# Patient Record
Sex: Male | Born: 1950 | Race: White | Hispanic: No | Marital: Single | Smoking: Never smoker
Health system: Southern US, Community
[De-identification: ages and names within clinical notes are randomized; demographics above are authoritative.]

## PROBLEM LIST (undated history)

## (undated) DIAGNOSIS — I2510 Atherosclerotic heart disease of native coronary artery without angina pectoris: Secondary | ICD-10-CM

## (undated) DIAGNOSIS — I219 Acute myocardial infarction, unspecified: Secondary | ICD-10-CM

## (undated) DIAGNOSIS — M1990 Unspecified osteoarthritis, unspecified site: Secondary | ICD-10-CM

## (undated) DIAGNOSIS — R51 Headache: Secondary | ICD-10-CM

## (undated) DIAGNOSIS — R519 Headache, unspecified: Secondary | ICD-10-CM

## (undated) DIAGNOSIS — M25469 Effusion, unspecified knee: Secondary | ICD-10-CM

## (undated) HISTORY — PX: CARDIAC SURGERY: SHX584

## (undated) HISTORY — PX: CARDIAC CATHETERIZATION: SHX172

---

## 2012-06-29 ENCOUNTER — Encounter (HOSPITAL_COMMUNITY): Payer: Self-pay | Admitting: Emergency Medicine

## 2012-06-29 ENCOUNTER — Emergency Department (HOSPITAL_COMMUNITY): Payer: Self-pay

## 2012-06-29 ENCOUNTER — Emergency Department (HOSPITAL_COMMUNITY)
Admission: EM | Admit: 2012-06-29 | Discharge: 2012-06-29 | Disposition: A | Discharge status: Home / Self Care | Payer: Self-pay | Attending: Emergency Medicine | Admitting: Emergency Medicine

## 2012-06-29 DIAGNOSIS — M549 Dorsalgia, unspecified: Secondary | ICD-10-CM | POA: Insufficient documentation

## 2012-06-29 DIAGNOSIS — Y9389 Activity, other specified: Secondary | ICD-10-CM | POA: Insufficient documentation

## 2012-06-29 DIAGNOSIS — I2510 Atherosclerotic heart disease of native coronary artery without angina pectoris: Secondary | ICD-10-CM | POA: Insufficient documentation

## 2012-06-29 DIAGNOSIS — R296 Repeated falls: Secondary | ICD-10-CM | POA: Insufficient documentation

## 2012-06-29 DIAGNOSIS — Y9289 Other specified places as the place of occurrence of the external cause: Secondary | ICD-10-CM | POA: Insufficient documentation

## 2012-06-29 DIAGNOSIS — S22080A Wedge compression fracture of T11-T12 vertebra, initial encounter for closed fracture: Secondary | ICD-10-CM

## 2012-06-29 DIAGNOSIS — Z7982 Long term (current) use of aspirin: Secondary | ICD-10-CM | POA: Insufficient documentation

## 2012-06-29 DIAGNOSIS — Z7902 Long term (current) use of antithrombotics/antiplatelets: Secondary | ICD-10-CM | POA: Insufficient documentation

## 2012-06-29 DIAGNOSIS — S22009A Unspecified fracture of unspecified thoracic vertebra, initial encounter for closed fracture: Secondary | ICD-10-CM | POA: Insufficient documentation

## 2012-06-29 DIAGNOSIS — Z79899 Other long term (current) drug therapy: Secondary | ICD-10-CM | POA: Insufficient documentation

## 2012-06-29 DIAGNOSIS — W309XXA Contact with unspecified agricultural machinery, initial encounter: Secondary | ICD-10-CM | POA: Insufficient documentation

## 2012-06-29 HISTORY — DX: Atherosclerotic heart disease of native coronary artery without angina pectoris: I25.10

## 2012-06-29 MED ORDER — HYDROCODONE-ACETAMINOPHEN 5-325 MG PO TABS: ORAL_TABLET | ORAL | Status: DC

## 2012-06-29 MED ORDER — IBUPROFEN 800 MG PO TABS: 800.0000 mg | ORAL_TABLET | Freq: Three times a day (TID) | ORAL | Status: DC

## 2012-06-29 NOTE — ED Provider Notes (Signed)
History    This chart was scribed for Geoffrey Kelly working with Geoffrey Kelly. Geoffrey Payor, MD by ED Scribe, Burman Nieves. This patient was seen in room TR07C/TR07C and the patient's care was started at 2:23 PM.   CSN: [id number]  Arrival date & time 06/29/12  1208   First MD Initiated Contact with Patient 06/29/12 1423      Chief Complaint  Patient presents with  . Back Pain  . Fall    (Consider location/radiation/quality/duration/timing/severity/associated sxs/prior treatment) Patient is a 62 y.o. male presenting with back pain and fall. The history is provided by the patient.  Back Pain Location:  Lumbar spine and thoracic spine Pain severity:  Moderate Timing:  Constant Progression:  Unchanged Context: falling   Relieved by:  Nothing Worsened by:  Movement Associated symptoms: no dysuria, no fever and no weakness   Fall Pertinent negatives include no fever, no nausea and no vomiting.   Geoffrey Kelly is a 63 y.o. male who presents to the Emergency Department complaining of moderate constant back pain resulting from a fall onset 5-6 days ago. Pt was lifting up a big log while operating his tractor. The log caused the tractor to "seesaw" causing the tractor to fall with the pt in it. Pt states that pain is a 9/10 on a pain scale. He states that at night when he gets up from lying down the pain is not as persistent until he starts moving. Pt states that he can not bend over due to the pain. Pt states he has been taking prescribed medication from a previous surgery which does not seem to alleviate sx's.  Pt states that he has had back problems before but not as serious as sx's he is experiencing as of now. Pt denies bladder/bowel incontinence, numbness/tingling in extremities, fever, chills, cough, nausea, vomiting, diarrhea, SOB, weakness, and any other associated symptoms. Pt has past medical hx of coronary artery disease and is currently on BP medication.   Past Medical History   Diagnosis Date  . Coronary artery disease     History reviewed. No pertinent past surgical history.  History reviewed. No pertinent family history.  History  Substance Use Topics  . Smoking status: Never Smoker   . Smokeless tobacco: Not on file  . Alcohol Use: No      Review of Systems  Constitutional: Negative for fever and chills.  HENT: Negative for congestion and neck stiffness.   Respiratory: Negative for shortness of breath.   Gastrointestinal: Negative for nausea and vomiting.  Genitourinary: Negative for dysuria.  Musculoskeletal: Positive for back pain.  Neurological: Negative for weakness.    Allergies  Review of patient's allergies indicates no known allergies.  Home Medications   Current Outpatient Rx  Name  Route  Sig  Dispense  Refill  . aspirin 81 MG tablet   Oral   Take 81 mg by mouth daily.         . clopidogrel (PLAVIX) 75 MG tablet   Oral   Take 75 mg by mouth daily.         Marland Kitchen lisinopril (PRINIVIL,ZESTRIL) 2.5 MG tablet   Oral   Take 1.25 mg by mouth daily.         . metoprolol tartrate (LOPRESSOR) 25 MG tablet   Oral   Take 25 mg by mouth 2 (two) times daily.         . pravastatin (PRAVACHOL) 40 MG tablet   Oral   Take 40 mg by mouth  daily.         Marland Kitchen HYDROcodone-acetaminophen (NORCO/VICODIN) 5-325 MG per tablet      Take 1-2 tabs by mouth every 4-6hrs as needed for pain.   10 tablet   0   . ibuprofen (ADVIL,MOTRIN) 800 MG tablet   Oral   Take 1 tablet (800 mg total) by mouth 3 (three) times daily.   21 tablet   0     BP 97/67  Pulse 61  Temp(Src) 97.9 F (36.6 C) (Oral)  Resp 18  SpO2 96%  Physical Exam  Nursing note and vitals reviewed. Constitutional: He is oriented to person, place, and time. He appears well-developed and well-nourished. No distress.  HENT:  Head: Normocephalic and atraumatic.  Eyes: EOM are normal.  Neck: Neck supple. No tracheal deviation present.  Cardiovascular: Normal rate.    Pulmonary/Chest: Effort normal. No respiratory distress.  Abdominal: Soft. Bowel sounds are normal. He exhibits no distension. There is no tenderness.  Musculoskeletal: Normal range of motion. He exhibits tenderness. He exhibits no edema.  Pt is tender upon palpation on T-9 to L-5 of his spine. Pain with hip flexion and rotation at the waist.   Neurological: He is alert and oriented to person, place, and time. He has normal strength. No cranial nerve deficit or sensory deficit. He exhibits normal muscle tone. He displays a negative Romberg sign. Coordination and gait normal.  Negative Romberg and Antalgic gate. Normal intact sensation.   Skin: Skin is warm and dry.  Psychiatric: He has a normal mood and affect. His behavior is normal.    ED Course  Procedures (including critical care time) DIAGNOSTIC STUDIES: Oxygen Saturation is 96% on room air, adequate by my interpretation.    COORDINATION OF CARE: 3:17 PM Discussed ED treatment with pt and pt agrees.  3:25 PM Discussed xray results with pt and pt agrees.   Labs Reviewed - No data to display Dg Lumbar Spine Complete  06/29/2012  *RADIOLOGY REPORT*  Clinical Data: Tractor injury 5 days ago.  Low back pain.  LUMBAR SPINE - COMPLETE 4+ VIEW  Comparison: None.  Findings: There is mild curvature convex to the left in the thoracolumbar region.  There are diminutive twelfth ribs with five non-rib bearing lumbar-type vertebral bodies.  I think there is probably an acute compression fracture at T12, more on the right, with loss of height of only about 10%.  No other fractures seen. There is ordinary degenerative facet disease in the lower lumbar spine.  IMPRESSION: Suspicion of acute but minor compression fracture at T12 with loss of height of about 10%, more on the right side of the vertebral body.   Original Report Authenticated By: Paulina Fusi, M.D.      1. Compression fracture of T12 vertebra, initial encounter   2. Back pain        MDM  Pt c/o back pain x1wk after tractor accident.  Pain has improved some since initial injury.  Denies hitting head or LOC.  Denies head or neck pain.  Denies numbness or tingling in arms, legs, or groin.  No loss of bowel or bladder. Plain film: mild compression fx at T12. Discussed with Dr. Rubin Kelly: w/o neurological symptoms, no further imaging is needed at this time.  Advised pt to take it easy for next 1-2wks if possible.  Pt explained it is corn season so he needs to do a lot of lifting.  Advised pt to listen to his own body and not try to work  through intense pain.  Return to ED or f/u with Dr. Leroy Kennedy, neurosurgery, if develops numbness or tingling in groin or legs.  Loss of bowel or bladder, unable to void.  Or other worrisome symptoms occur. Rx: norco and ibuprofen.  Advised not to operate heavy machinery while on norco.   I personally performed the services described in this documentation, which was scribed in my presence. The recorded information has been reviewed and is accurate.  Vitals: unremarkable. Discharged in stable condition.    Discussed pt with attending during ED encounter.      Geoffrey Finner, PA-C 06/30/12 1827

## 2012-06-29 NOTE — ED Notes (Signed)
Pt c/o lower back pain after the tractor he was on dropped approx 5 ft. This happened 5 days ago. States has been taking some "old pain pills" that he had from previous injury. Denies paresthesias.

## 2012-06-29 NOTE — ED Notes (Signed)
Pt c/o lower back pain after falling approx 5 feet 6 days ago

## 2012-07-01 NOTE — ED Provider Notes (Signed)
Medical screening examination/treatment/procedure(s) were performed by non-physician practitioner and as supervising physician I was immediately available for consultation/collaboration.  Josefina Rynders R. Elsworth Ledin, MD 07/01/12 0020 

## 2014-08-29 ENCOUNTER — Other Ambulatory Visit: Payer: Self-pay | Admitting: Unknown Physician Specialty

## 2014-08-29 DIAGNOSIS — M25561 Pain in right knee: Secondary | ICD-10-CM

## 2014-09-02 ENCOUNTER — Ambulatory Visit: Payer: Self-pay

## 2014-09-30 ENCOUNTER — Other Ambulatory Visit: Payer: Self-pay | Admitting: Unknown Physician Specialty

## 2014-09-30 ENCOUNTER — Other Ambulatory Visit: Payer: Self-pay | Admitting: Sports Medicine

## 2014-09-30 DIAGNOSIS — M25561 Pain in right knee: Secondary | ICD-10-CM

## 2014-10-02 ENCOUNTER — Other Ambulatory Visit: Payer: Self-pay

## 2014-10-03 ENCOUNTER — Emergency Department (HOSPITAL_COMMUNITY)
Admission: EM | Admit: 2014-10-03 | Discharge: 2014-10-03 | Disposition: A | Discharge status: Home / Self Care | Payer: 59 | Attending: Emergency Medicine | Admitting: Emergency Medicine

## 2014-10-03 ENCOUNTER — Encounter (HOSPITAL_COMMUNITY): Payer: Self-pay

## 2014-10-03 DIAGNOSIS — I2510 Atherosclerotic heart disease of native coronary artery without angina pectoris: Secondary | ICD-10-CM | POA: Insufficient documentation

## 2014-10-03 DIAGNOSIS — Z79899 Other long term (current) drug therapy: Secondary | ICD-10-CM | POA: Diagnosis not present

## 2014-10-03 DIAGNOSIS — M5416 Radiculopathy, lumbar region: Secondary | ICD-10-CM | POA: Diagnosis not present

## 2014-10-03 DIAGNOSIS — M545 Low back pain: Secondary | ICD-10-CM | POA: Diagnosis present

## 2014-10-03 DIAGNOSIS — Z7982 Long term (current) use of aspirin: Secondary | ICD-10-CM | POA: Diagnosis not present

## 2014-10-03 DIAGNOSIS — Z7902 Long term (current) use of antithrombotics/antiplatelets: Secondary | ICD-10-CM | POA: Diagnosis not present

## 2014-10-03 MED ORDER — METHOCARBAMOL 1000 MG/10ML IJ SOLN
1000.0000 mg | Freq: Once | INTRAVENOUS | Status: AC
  Administered 2014-10-03: 1000 mg via INTRAVENOUS
  Filled 2014-10-03: qty 10

## 2014-10-03 MED ORDER — METHYLPREDNISOLONE SODIUM SUCC 125 MG IJ SOLR
125.0000 mg | Freq: Once | INTRAMUSCULAR | Status: AC
Start: 2014-10-03 — End: 2014-10-03
  Administered 2014-10-03: 125 mg via INTRAVENOUS
  Filled 2014-10-03: qty 2

## 2014-10-03 MED ORDER — METHOCARBAMOL 1000 MG/10ML IJ SOLN
1000.0000 mg | Freq: Once | INTRAMUSCULAR | Status: DC
  Filled 2014-10-03: qty 10

## 2014-10-03 MED ORDER — SENNOSIDES-DOCUSATE SODIUM 8.6-50 MG PO TABS: 1.0000 | ORAL_TABLET | Freq: Every evening | ORAL | Status: DC | PRN

## 2014-10-03 MED ORDER — OXYCODONE-ACETAMINOPHEN 5-325 MG PO TABS: ORAL_TABLET | ORAL | Status: DC

## 2014-10-03 MED ORDER — PREDNISONE 20 MG PO TABS: 40.0000 mg | ORAL_TABLET | Freq: Every day | ORAL | Status: DC

## 2014-10-03 MED ORDER — MORPHINE SULFATE 4 MG/ML IJ SOLN
4.0000 mg | Freq: Once | INTRAMUSCULAR | Status: AC
  Administered 2014-10-03: 4 mg via INTRAVENOUS
  Filled 2014-10-03: qty 1

## 2014-10-03 MED ORDER — METHOCARBAMOL 500 MG PO TABS: 1000.0000 mg | ORAL_TABLET | Freq: Four times a day (QID) | ORAL | Status: DC | PRN

## 2014-10-03 NOTE — ED Notes (Signed)
Pt. Ambulated in hallway independently with no complaints of pain or dizziness. Gait steady and even.

## 2014-10-03 NOTE — ED Provider Notes (Signed)
CSN: [id number]     Arrival date & time 10/03/14  1052 History   First MD Initiated Contact with Patient 10/03/14 1056     Chief Complaint  Patient presents with  . Back Pain     (Consider location/radiation/quality/duration/timing/severity/associated sxs/prior Treatment) HPI  Blood pressure 120/90, pulse 72, temperature 97.7 F (36.5 C), temperature source Oral, resp. rate 20, SpO2 96 %.  Geoffrey Kelly is a 64 y.o. male complaining of severe low back pain that radiates down the posterior left leg to the knee. States that this has been worsening over the course of several weeks. Patient denies any trauma, history of cancer, history of IV drug use, fever, chills, incontinence, abdominal pain. Patient is normally ambulatory independently right now he is ambulating with a crutch. 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 He states that he's been taking Vicodin and Flexeril at home with little relief, states that the pain keeps him from sleep at night. Patient was sent to the ED by his PCP, he had an MRI several days ago it is unclear what the results are. Patient states that he recently had a steroid-induced shot into the right knee and states that this completely resolved his pain, he denies any swelling and reduced range of motion.  Past Medical History  Diagnosis Date  . Coronary artery disease    Past Surgical History  Procedure Laterality Date  . Cardiac surgery      stent placement 2013   No family history on file. History  Substance Use Topics  . Smoking status: Never Smoker   . Smokeless tobacco: Not on file  . Alcohol Use: No    Review of Systems  10 systems reviewed and found to be negative, except as noted in the HPI.   Allergies  Review of patient's allergies indicates no known allergies.  Home Medications   Prior to Admission medications   Medication Sig Start Date End Date Taking? Authorizing Provider  aspirin 81 MG tablet  Take 81 mg by mouth daily.    Historical Provider, MD  clopidogrel (PLAVIX) 75 MG tablet Take 75 mg by mouth daily.    Historical Provider, MD  HYDROcodone-acetaminophen (NORCO/VICODIN) 5-325 MG per tablet Take 1-2 tabs by mouth every 4-6hrs as needed for pain. 06/29/12   Junius Finner, PA-C  ibuprofen (ADVIL,MOTRIN) 800 MG tablet Take 1 tablet (800 mg total) by mouth 3 (three) times daily. 06/29/12   Junius Finner, PA-C  lisinopril (PRINIVIL,ZESTRIL) 2.5 MG tablet Take 1.25 mg by mouth daily.    Historical Provider, MD  metoprolol tartrate (LOPRESSOR) 25 MG tablet Take 25 mg by mouth 2 (two) times daily.    Historical Provider, MD  pravastatin (PRAVACHOL) 40 MG tablet Take 40 mg by mouth daily.    Historical Provider, MD   BP 129/79 mmHg  Pulse 66  Temp(Src) 97.7 F (36.5 C) (Oral)  Resp 22  SpO2 96% Physical Exam  Constitutional: He appears well-developed and well-nourished.  HENT:  Head: Normocephalic.  Mouth/Throat: Oropharynx is clear and moist.  Eyes: Conjunctivae are normal.  Neck: Normal range of motion.  Cardiovascular: Normal rate, regular rhythm and intact distal pulses.   Pulmonary/Chest: Effort normal and breath sounds normal.  Abdominal: Soft. Bowel sounds are normal. There is no tenderness.  Musculoskeletal: He exhibits tenderness.       Arms: Neurological: He is alert.  No point tenderness to percussion of lumbar spinal processes.  No TTP or paraspinal muscular spasm.  Strength is 5 out of 5 to bilateral lower extremities at hip and knee; extensor hallucis longus 5 out of 5. Ankle strength 5 out of 5, no clonus, neurovascularly intact. No saddle anaesthesia. Patellar reflexes are 2+ bilaterally.    Straight leg raise is positive on the left side at 40, negative on the right.  Right knee:No deformity, erythema or abrasions. FROM. No effusion or crepitance. Anterior and posterior drawer show no abnormal laxity. Stable to valgus and varus stress. Joint lines are non-tender.  Neurovascularly intact. Pt ambulates with non-antalgic gait.     Psychiatric: He has a normal mood and affect.  Nursing note and vitals reviewed.   ED Course  Procedures (including critical care time) Labs Review Labs Reviewed - No data to display  Imaging Review No results found.   EKG Interpretation None      MDM   Final diagnoses:  None    Filed Vitals:   10/03/14 1145 10/03/14 1230 10/03/14 1315 10/03/14 1445  BP: 129/79 132/90 123/84 120/90  Pulse: 66 71 62 72  Temp:      TempSrc:      Resp:  20 20 20   SpO2: 96% 97% 95% 96%    Medications  morphine 4 MG/ML injection 4 mg (4 mg Intravenous Given 10/03/14 1303)  methylPREDNISolone sodium succinate (SOLU-MEDROL) 125 mg/2 mL injection 125 mg (125 mg Intravenous Given 10/03/14 1302)  methocarbamol (ROBAXIN) 1,000 mg in dextrose 5 % 50 mL IVPB (0 mg Intravenous Stopped 10/03/14 1424)    Geoffrey Kelly is a pleasant 64 y.o. male presenting with atraumatic low back pain with left-sided radiculopathy. Neuro exam is nonfocal. Patient was sent by primary care for unclear reasons. I have obtained the repeat of his MRI and it shows a mild degenerative disease without central canal stenosis, degenerative disc and endplate spur at the L2-L3 level bilaterally without compression or displacement.   Patient states that he "wants this fixed." He initially declined pain medication. Explained to him that quick fixes normally not an option with lumbar pain. I've convinced him to try a stronger pain medication. He is ambulatory without issue with more aggressive pain control. Patient will be given neurosurgical referral and advised to follow closely with his orthopedist at [facility].  Evaluation does not show pathology that would require ongoing emergent intervention or inpatient treatment. Pt is hemodynamically stable and mentating appropriately. Discussed findings and plan with patient/guardian, who agrees with care plan. All  questions answered. Return precautions discussed and outpatient follow up given.   Discharge Medication List as of 10/03/2014  2:34 PM    START taking these medications   Details  methocarbamol (ROBAXIN) 500 MG tablet Take 2 tablets (1,000 mg total) by mouth 4 (four) times daily as needed (Pain)., Starting 10/03/2014, Until Discontinued, Print    oxyCODONE-acetaminophen (PERCOCET/ROXICET) 5-325 MG per tablet 1 to 2 tabs PO q6hrs  PRN for pain, Print    predniSONE (DELTASONE) 20 MG tablet Take 2 tablets (40 mg total) by mouth daily., Starting 10/03/2014, Until Discontinued, Print    senna-docusate (SENOKOT-S) 8.6-50 MG per tablet Take 1 tablet by mouth at bedtime as needed for mild constipation., Starting 10/03/2014, Until Discontinued, Print             Wynetta Emery, PA-C 10/03/14 1538  Elwin Mocha, MD 10/03/14 [phone]

## 2014-10-03 NOTE — ED Notes (Signed)
Pt. States he had an MRI done at [place] today for back pain. Pt. States he tried to call and make an appointment with his family dr. But was told to come here instead. Pt. States pain is not bad now, but states he has had some difficulty sleeping at night due to pain. Pt. States pain hurts back and left leg.

## 2014-10-03 NOTE — Discharge Instructions (Signed)
Please take ibuprofen 400mg  (this is normally 2 over the counter pills) every 6 hours (take with food to minimze stomach irritation).   Take robaxin and/or percocet for breakthrough pain, do not drink alcohol, drive, care for children or perfom other critical tasks while taking robaxin and/or percocet.  Please follow with your primary care doctor in the next 2 days for a check-up. They must obtain records for further management.   Do not hesitate to return to the Emergency Department for any new, worsening or concerning symptoms.    Lumbosacral Radiculopathy Lumbosacral radiculopathy is a pinched nerve or nerves in the low back (lumbosacral area). When this happens you may have weakness in your legs and may not be able to stand on your toes. You may have pain going down into your legs. There may be difficulties with walking normally. There are many causes of this problem. Sometimes this may happen from an injury, or simply from arthritis or boney problems. It may also be caused by other illnesses such as diabetes. If there is no improvement after treatment, further studies may be done to find the exact cause. DIAGNOSIS  X-rays may be needed if the problems become long standing. Electromyograms may be done. This study is one in which the working of nerves and muscles is studied. HOME CARE INSTRUCTIONS   Applications of ice packs may be helpful. Ice can be used in a plastic bag with a towel around it to prevent frostbite to skin. This may be used every 2 hours for 20 to 30 minutes, or as needed, while awake, or as directed by your caregiver.  Only take over-the-counter or prescription medicines for pain, discomfort, or fever as directed by your caregiver.  If physical therapy was prescribed, follow your caregiver's directions. SEEK IMMEDIATE MEDICAL CARE IF:   You have pain not controlled with medications.  You seem to be getting worse rather than better.  You develop increasing weakness in  your legs.  You develop loss of bowel or bladder control.  You have difficulty with walking or balance, or develop clumsiness in the use of your legs.  You have a fever. MAKE SURE YOU:   Understand these instructions.  Will watch your condition.  Will get help right away if you are not doing well or get worse. Document Released: 02/18/2005 Document Revised: 05/13/2011 Document Reviewed: 10/09/2007 [facility] Patient Information 2015 [place], [place]. This information is not intended to replace advice given to you by your health care provider. Make sure you discuss any questions you have with your health care provider.

## 2014-10-06 ENCOUNTER — Other Ambulatory Visit: Payer: Self-pay

## 2014-10-12 DIAGNOSIS — E785 Hyperlipidemia, unspecified: Secondary | ICD-10-CM | POA: Insufficient documentation

## 2014-10-12 DIAGNOSIS — I25119 Atherosclerotic heart disease of native coronary artery with unspecified angina pectoris: Secondary | ICD-10-CM

## 2014-10-12 HISTORY — DX: Hyperlipidemia, unspecified: E78.5

## 2014-10-12 HISTORY — DX: Atherosclerotic heart disease of native coronary artery with unspecified angina pectoris: I25.119

## 2014-10-28 ENCOUNTER — Other Ambulatory Visit: Payer: Self-pay | Admitting: Neurosurgery

## 2014-10-31 NOTE — Progress Notes (Signed)
Patient's sister called with questions about where to bring patient day of surgery, instructions on medications, etc. I advised her that I could not give out patient specific information. I give her directions to the hospital. I contacted patient. He advised that the office had contacted him regarding the PAT appointment.

## 2014-11-02 ENCOUNTER — Encounter (HOSPITAL_COMMUNITY): Payer: Self-pay

## 2014-11-02 ENCOUNTER — Encounter (HOSPITAL_COMMUNITY)
Admission: RE | Admit: 2014-11-02 | Discharge: 2014-11-02 | Disposition: A | Discharge status: Home / Self Care | Payer: 59 | Source: Ambulatory Visit | Attending: Neurosurgery | Admitting: Neurosurgery

## 2014-11-02 DIAGNOSIS — I2510 Atherosclerotic heart disease of native coronary artery without angina pectoris: Secondary | ICD-10-CM | POA: Diagnosis not present

## 2014-11-02 DIAGNOSIS — M5116 Intervertebral disc disorders with radiculopathy, lumbar region: Secondary | ICD-10-CM | POA: Diagnosis present

## 2014-11-02 DIAGNOSIS — Z955 Presence of coronary angioplasty implant and graft: Secondary | ICD-10-CM | POA: Diagnosis not present

## 2014-11-02 DIAGNOSIS — I252 Old myocardial infarction: Secondary | ICD-10-CM | POA: Diagnosis not present

## 2014-11-02 DIAGNOSIS — Z7982 Long term (current) use of aspirin: Secondary | ICD-10-CM | POA: Diagnosis not present

## 2014-11-02 DIAGNOSIS — Z79899 Other long term (current) drug therapy: Secondary | ICD-10-CM | POA: Diagnosis not present

## 2014-11-02 DIAGNOSIS — M1990 Unspecified osteoarthritis, unspecified site: Secondary | ICD-10-CM | POA: Diagnosis not present

## 2014-11-02 HISTORY — DX: Headache, unspecified: R51.9

## 2014-11-02 HISTORY — DX: Acute myocardial infarction, unspecified: I21.9

## 2014-11-02 HISTORY — DX: Headache: R51

## 2014-11-02 HISTORY — DX: Unspecified osteoarthritis, unspecified site: M19.90

## 2014-11-02 HISTORY — DX: Effusion, unspecified knee: M25.469

## 2014-11-02 LAB — BASIC METABOLIC PANEL
ANION GAP: 6 (ref 5–15)
BUN: 10 mg/dL (ref 6–20)
CHLORIDE: 103 mmol/L (ref 101–111)
CO2: 27 mmol/L (ref 22–32)
Calcium: 9.6 mg/dL (ref 8.9–10.3)
Creatinine, Ser: 0.85 mg/dL (ref 0.61–1.24)
GFR calc non Af Amer: >60 mL/min (ref >60)
Glucose, Bld: 101 mg/dL — ABNORMAL HIGH (ref 65–99)
Potassium: 4.3 mmol/L (ref 3.5–5.1)
Sodium: 136 mmol/L (ref 135–145)

## 2014-11-02 LAB — CBC
HEMATOCRIT: 42.8 % (ref 39.0–52.0)
HEMOGLOBIN: 13.9 g/dL (ref 13.0–17.0)
MCH: 28.5 pg (ref 26.0–34.0)
MCHC: 32.5 g/dL (ref 30.0–36.0)
MCV: 87.7 fL (ref 78.0–100.0)
Platelets: 247 10*3/uL (ref 150–400)
RBC: 4.88 MIL/uL (ref 4.22–5.81)
RDW: 13.6 % (ref 11.5–15.5)
WBC: 6.7 10*3/uL (ref 4.0–10.5)

## 2014-11-02 LAB — SURGICAL PCR SCREEN
MRSA, PCR: NEGATIVE
STAPHYLOCOCCUS AUREUS: NEGATIVE

## 2014-11-02 MED ORDER — CEFAZOLIN SODIUM-DEXTROSE 2-3 GM-% IV SOLR
2.0000 g | INTRAVENOUS | Status: AC
  Administered 2014-11-03: 2 g via INTRAVENOUS
  Filled 2014-11-02: qty 50

## 2014-11-02 NOTE — Pre-Procedure Instructions (Signed)
    Geoffrey Kelly  11/02/2014      [facility] [address], [place] - [address] [place] [address] Phone: [phone] Fax: [phone]    Your procedure is scheduled on 11/03/14.  Report to [facility] Admitting at 1145 A.M.  Call this number if you have problems the morning of surgery:  [phone]   Remember:  Do not eat food or drink liquids after midnight.  Take these medicines the morning of surgery with A SIP OF WATER --metoprolol   Do not wear jewelry, make-up or nail polish.  Do not wear lotions, powders, or perfumes.  You may wear deodorant.  Do not shave 48 hours prior to surgery.  Men may shave face and neck.  Do not bring valuables to the hospital.  [facility] is not responsible for any belongings or valuables.  Contacts, dentures or bridgework may not be worn into surgery.  Leave your suitcase in the car.  After surgery it may be brought to your room.  For patients admitted to the hospital, discharge time will be determined by your treatment team.  Patients discharged the day of surgery will not be allowed to drive home.   Name and phone number of your driver:   Special instructions:    Please read over the following fact sheets that you were given. Pain Booklet, Coughing and Deep Breathing, MRSA Information and Surgical Site Infection Prevention

## 2014-11-02 NOTE — Progress Notes (Signed)
Anesthesia PAT Evaluation: Patient is a 64 year old male scheduled for left L4-5 diskectomy tomorrow by Dr. Lovell Sheehan.  History includes non-smoker, CAD/MI s/p stent '13 ([facility]), arthritis, headaches. He has also been seen for a right knee effusion s/p aspiration ~ May or June 2016. His ANA direct, RA Latex Turbid, sed rate (Westergren), Lyme Ab, and [place] Spotted Fever IgM testing were unremarkable (08/26/14; [facility]). He has persistent right knee swelling, but not as significant as when he had his aspiration. He received a cortisone injection ~ three weeks ago. No fever or right knee redness. His mobility is affected by his knee pain. He and his brother think he may need rehab post-operatively.   Meds include ASA, Lopressor, pravastatin.   PCP is Dr. Brent Bulla.   He was seen by cardiologist Dr. Belva Crome with [facility] on 10/12/14 for a preoperative evaluation. Has also seen Dr. Bing Matter.in the past. Dr. Homero Fellers recommended a Lexiscan sestamibi, and if negative patient would not be considered at high risk for coronary events for this procedure.   10/18/14 EKG: SR, right atrial abnormality and rotation, possible pulmonology disease.   10/25/14 Nuclear stress test [facility]):  1. No evidence of ischemia on the scan. 2. Normal EF, 67%.. 3. No significant symptoms, EKG changes, or arrhythmias occurred.  Preoperative labs noted. (Patient had labs from 10/18/14 that I initially thought would likely be okay to use for surgery; however, after reported persistent knee swelling I opted to have him get a CBC and BMET that could evaluate for leukocytosis or renal insufficiency. Labs were unremarkable. There was no right knee erythema on exam today.)  I notified Darl Pikes at Dr. Lovell Sheehan' office of patient' right knee effusion. She said this was also outlined in Dr. Lovell Sheehan' office notes. I also notified her that patient was requesting post-operative rehab services. I  asked patient to also discuss with him tomorrow.   2013 Cardiac cath report requested from [facility], but is still pending. (Update: [facility] records still pending after 4 PM. I called and fax requested cath and echo report as available. Holding room staff can follow-up records if I don't receive them by 5 PM today.) With recent non-ischemic stress test, I would anticipate that he could proceed as planned if no acute changes.   Velna Ochs [facility] Short Stay Center/Anesthesiology Phone [phone] 11/02/2014 1:21 PM

## 2014-11-03 ENCOUNTER — Encounter (HOSPITAL_COMMUNITY): Payer: Self-pay | Admitting: *Deleted

## 2014-11-03 ENCOUNTER — Observation Stay (HOSPITAL_COMMUNITY)
Admission: RE | Admit: 2014-11-03 | Discharge: 2014-11-04 | Disposition: A | Discharge status: Home / Self Care | Payer: 59 | Source: Ambulatory Visit | Attending: Neurosurgery | Admitting: Neurosurgery

## 2014-11-03 ENCOUNTER — Encounter (HOSPITAL_COMMUNITY)
Admission: RE | Disposition: A | Discharge status: Home / Self Care | Payer: Self-pay | Source: Ambulatory Visit | Attending: Neurosurgery

## 2014-11-03 ENCOUNTER — Ambulatory Visit (HOSPITAL_COMMUNITY): Payer: 59 | Admitting: Emergency Medicine

## 2014-11-03 ENCOUNTER — Ambulatory Visit (HOSPITAL_COMMUNITY): Payer: 59

## 2014-11-03 ENCOUNTER — Ambulatory Visit (HOSPITAL_COMMUNITY): Payer: 59 | Admitting: Certified Registered Nurse Anesthetist

## 2014-11-03 DIAGNOSIS — Z419 Encounter for procedure for purposes other than remedying health state, unspecified: Secondary | ICD-10-CM

## 2014-11-03 DIAGNOSIS — M1990 Unspecified osteoarthritis, unspecified site: Secondary | ICD-10-CM | POA: Insufficient documentation

## 2014-11-03 DIAGNOSIS — M5126 Other intervertebral disc displacement, lumbar region: Secondary | ICD-10-CM

## 2014-11-03 DIAGNOSIS — Z7982 Long term (current) use of aspirin: Secondary | ICD-10-CM | POA: Insufficient documentation

## 2014-11-03 DIAGNOSIS — M5116 Intervertebral disc disorders with radiculopathy, lumbar region: Principal | ICD-10-CM | POA: Insufficient documentation

## 2014-11-03 DIAGNOSIS — I2510 Atherosclerotic heart disease of native coronary artery without angina pectoris: Secondary | ICD-10-CM | POA: Insufficient documentation

## 2014-11-03 DIAGNOSIS — I252 Old myocardial infarction: Secondary | ICD-10-CM | POA: Insufficient documentation

## 2014-11-03 DIAGNOSIS — Z79899 Other long term (current) drug therapy: Secondary | ICD-10-CM | POA: Insufficient documentation

## 2014-11-03 DIAGNOSIS — Z955 Presence of coronary angioplasty implant and graft: Secondary | ICD-10-CM | POA: Insufficient documentation

## 2014-11-03 HISTORY — PX: LUMBAR LAMINECTOMY/DECOMPRESSION MICRODISCECTOMY: SHX5026

## 2014-11-03 HISTORY — DX: Other intervertebral disc displacement, lumbar region: M51.26

## 2014-11-03 SURGERY — LUMBAR LAMINECTOMY/DECOMPRESSION MICRODISCECTOMY 1 LEVEL
Anesthesia: General | Laterality: Left

## 2014-11-03 MED ORDER — MENTHOL 3 MG MT LOZG
1.0000 | LOZENGE | OROMUCOSAL | Status: DC | PRN
Start: 2014-11-03 — End: 2014-11-04
  Filled 2014-11-03: qty 9

## 2014-11-03 MED ORDER — ONDANSETRON HCL 4 MG/2ML IJ SOLN
INTRAMUSCULAR | Status: AC
  Filled 2014-11-03: qty 2

## 2014-11-03 MED ORDER — HYDROMORPHONE HCL 1 MG/ML IJ SOLN: 0.2500 mg | INTRAMUSCULAR | Status: DC | PRN

## 2014-11-03 MED ORDER — ALUM & MAG HYDROXIDE-SIMETH 200-200-20 MG/5ML PO SUSP: 30.0000 mL | Freq: Four times a day (QID) | ORAL | Status: DC | PRN

## 2014-11-03 MED ORDER — PROMETHAZINE HCL 25 MG/ML IJ SOLN: 6.2500 mg | INTRAMUSCULAR | Status: DC | PRN

## 2014-11-03 MED ORDER — GLYCOPYRROLATE 0.2 MG/ML IJ SOLN
INTRAMUSCULAR | Status: AC
  Filled 2014-11-03: qty 2

## 2014-11-03 MED ORDER — ONDANSETRON HCL 4 MG/2ML IJ SOLN: 4.0000 mg | INTRAMUSCULAR | Status: DC | PRN

## 2014-11-03 MED ORDER — BISACODYL 10 MG RE SUPP
10.0000 mg | Freq: Every day | RECTAL | Status: DC | PRN
Start: 2014-11-03 — End: 2014-11-04

## 2014-11-03 MED ORDER — DEXAMETHASONE SODIUM PHOSPHATE 4 MG/ML IJ SOLN
INTRAMUSCULAR | Status: AC
  Filled 2014-11-03: qty 1

## 2014-11-03 MED ORDER — MEPERIDINE HCL 25 MG/ML IJ SOLN: 6.2500 mg | INTRAMUSCULAR | Status: DC | PRN

## 2014-11-03 MED ORDER — LIDOCAINE HCL (CARDIAC) 20 MG/ML IV SOLN
INTRAVENOUS | Status: DC | PRN
  Administered 2014-11-03: 50 mg via INTRAVENOUS

## 2014-11-03 MED ORDER — ONDANSETRON HCL 4 MG/2ML IJ SOLN
INTRAMUSCULAR | Status: DC | PRN
  Administered 2014-11-03: 4 mg via INTRAVENOUS

## 2014-11-03 MED ORDER — EPHEDRINE SULFATE 50 MG/ML IJ SOLN
INTRAMUSCULAR | Status: DC | PRN
  Administered 2014-11-03 (×2): 10 mg via INTRAVENOUS

## 2014-11-03 MED ORDER — FENTANYL CITRATE (PF) 100 MCG/2ML IJ SOLN
INTRAMUSCULAR | Status: DC | PRN
  Administered 2014-11-03 (×3): 50 ug via INTRAVENOUS

## 2014-11-03 MED ORDER — 0.9 % SODIUM CHLORIDE (POUR BTL) OPTIME
TOPICAL | Status: DC | PRN
  Administered 2014-11-03: 1000 mL

## 2014-11-03 MED ORDER — DOCUSATE SODIUM 100 MG PO CAPS
100.0000 mg | ORAL_CAPSULE | Freq: Two times a day (BID) | ORAL | Status: DC
  Administered 2014-11-03: 100 mg via ORAL
  Filled 2014-11-03: qty 1

## 2014-11-03 MED ORDER — LACTATED RINGERS IV SOLN: INTRAVENOUS | Status: DC

## 2014-11-03 MED ORDER — METOPROLOL TARTRATE 25 MG PO TABS
25.0000 mg | ORAL_TABLET | Freq: Two times a day (BID) | ORAL | Status: DC
  Administered 2014-11-03: 25 mg via ORAL
  Filled 2014-11-03 (×3): qty 1

## 2014-11-03 MED ORDER — ACETAMINOPHEN 10 MG/ML IV SOLN
INTRAVENOUS | Status: AC
  Administered 2014-11-03: 1000 mg via INTRAVENOUS
  Filled 2014-11-03: qty 100

## 2014-11-03 MED ORDER — PHENYLEPHRINE HCL 10 MG/ML IJ SOLN
INTRAMUSCULAR | Status: DC | PRN
  Administered 2014-11-03: 40 ug via INTRAVENOUS
  Administered 2014-11-03: 120 ug via INTRAVENOUS
  Administered 2014-11-03: 40 ug via INTRAVENOUS
  Administered 2014-11-03: 120 ug via INTRAVENOUS
  Administered 2014-11-03: 40 ug via INTRAVENOUS
  Administered 2014-11-03 (×2): 120 ug via INTRAVENOUS
  Administered 2014-11-03: 80 ug via INTRAVENOUS
  Administered 2014-11-03: 120 ug via INTRAVENOUS

## 2014-11-03 MED ORDER — OXYCODONE-ACETAMINOPHEN 5-325 MG PO TABS
1.0000 | ORAL_TABLET | ORAL | Status: DC | PRN
  Administered 2014-11-04: 1 via ORAL
  Filled 2014-11-03: qty 1

## 2014-11-03 MED ORDER — BACITRACIN ZINC 500 UNIT/GM EX OINT
TOPICAL_OINTMENT | CUTANEOUS | Status: DC | PRN
  Administered 2014-11-03: 1 via TOPICAL

## 2014-11-03 MED ORDER — NEOSTIGMINE METHYLSULFATE 10 MG/10ML IV SOLN
INTRAVENOUS | Status: DC | PRN
  Administered 2014-11-03: 3 mg via INTRAVENOUS

## 2014-11-03 MED ORDER — PROPOFOL 10 MG/ML IV BOLUS
INTRAVENOUS | Status: DC | PRN
  Administered 2014-11-03: 150 mg via INTRAVENOUS

## 2014-11-03 MED ORDER — MIDAZOLAM HCL 2 MG/2ML IJ SOLN
INTRAMUSCULAR | Status: AC
  Filled 2014-11-03: qty 4

## 2014-11-03 MED ORDER — GLYCOPYRROLATE 0.2 MG/ML IJ SOLN
INTRAMUSCULAR | Status: DC | PRN
  Administered 2014-11-03: 0.4 mg via INTRAVENOUS

## 2014-11-03 MED ORDER — PHENOL 1.4 % MT LIQD: 1.0000 | OROMUCOSAL | Status: DC | PRN

## 2014-11-03 MED ORDER — CEFAZOLIN SODIUM-DEXTROSE 2-3 GM-% IV SOLR
2.0000 g | Freq: Three times a day (TID) | INTRAVENOUS | Status: AC
  Administered 2014-11-03 – 2014-11-04 (×2): 2 g via INTRAVENOUS
  Filled 2014-11-03 (×2): qty 50

## 2014-11-03 MED ORDER — DEXAMETHASONE SODIUM PHOSPHATE 4 MG/ML IJ SOLN
INTRAMUSCULAR | Status: DC | PRN
  Administered 2014-11-03: 4 mg via INTRAVENOUS

## 2014-11-03 MED ORDER — MIDAZOLAM HCL 5 MG/5ML IJ SOLN
INTRAMUSCULAR | Status: DC | PRN
  Administered 2014-11-03: 2 mg via INTRAVENOUS

## 2014-11-03 MED ORDER — ROCURONIUM BROMIDE 100 MG/10ML IV SOLN
INTRAVENOUS | Status: DC | PRN
  Administered 2014-11-03: 30 mg via INTRAVENOUS

## 2014-11-03 MED ORDER — NEOSTIGMINE METHYLSULFATE 10 MG/10ML IV SOLN
INTRAVENOUS | Status: AC
  Filled 2014-11-03: qty 1

## 2014-11-03 MED ORDER — ACETAMINOPHEN 325 MG PO TABS: 650.0000 mg | ORAL_TABLET | ORAL | Status: DC | PRN

## 2014-11-03 MED ORDER — PROPOFOL 10 MG/ML IV BOLUS
INTRAVENOUS | Status: AC
  Filled 2014-11-03: qty 20

## 2014-11-03 MED ORDER — BUPIVACAINE-EPINEPHRINE (PF) 0.5% -1:200000 IJ SOLN
INTRAMUSCULAR | Status: DC | PRN
  Administered 2014-11-03 (×2): 10 mL

## 2014-11-03 MED ORDER — MORPHINE SULFATE (PF) 2 MG/ML IV SOLN: 1.0000 mg | INTRAVENOUS | Status: DC | PRN

## 2014-11-03 MED ORDER — DIAZEPAM 5 MG PO TABS
5.0000 mg | ORAL_TABLET | Freq: Four times a day (QID) | ORAL | Status: DC | PRN
  Administered 2014-11-04: 5 mg via ORAL
  Filled 2014-11-03: qty 1

## 2014-11-03 MED ORDER — FENTANYL CITRATE (PF) 250 MCG/5ML IJ SOLN
INTRAMUSCULAR | Status: AC
  Filled 2014-11-03: qty 5

## 2014-11-03 MED ORDER — SODIUM CHLORIDE 0.9 % IR SOLN
Status: DC | PRN
  Administered 2014-11-03: 15:00:00

## 2014-11-03 MED ORDER — LACTATED RINGERS IV SOLN
INTRAVENOUS | Status: DC
  Administered 2014-11-03 (×3): via INTRAVENOUS

## 2014-11-03 MED ORDER — PRAVASTATIN SODIUM 80 MG PO TABS
80.0000 mg | ORAL_TABLET | Freq: Every day | ORAL | Status: DC
  Administered 2014-11-03: 80 mg via ORAL
  Filled 2014-11-03 (×2): qty 1

## 2014-11-03 MED ORDER — ACETAMINOPHEN 650 MG RE SUPP: 650.0000 mg | RECTAL | Status: DC | PRN

## 2014-11-03 MED ORDER — HYDROCODONE-ACETAMINOPHEN 5-325 MG PO TABS: 1.0000 | ORAL_TABLET | ORAL | Status: DC | PRN

## 2014-11-03 MED ORDER — THROMBIN 5000 UNITS EX SOLR
CUTANEOUS | Status: DC | PRN
  Administered 2014-11-03 (×2): 5000 [IU] via TOPICAL

## 2014-11-03 MED ORDER — HEMOSTATIC AGENTS (NO CHARGE) OPTIME
TOPICAL | Status: DC | PRN
  Administered 2014-11-03: 1 via TOPICAL

## 2014-11-03 SURGICAL SUPPLY — 47 items
BAG DECANTER FOR FLEXI CONT (MISCELLANEOUS) ×3 IMPLANT
BENZOIN TINCTURE PRP APPL 2/3 (GAUZE/BANDAGES/DRESSINGS) ×3 IMPLANT
BLADE CLIPPER SURG (BLADE) IMPLANT
BRUSH SCRUB EZ PLAIN DRY (MISCELLANEOUS) ×3 IMPLANT
BUR MATCHSTICK NEURO 3.0 LAGG (BURR) ×3 IMPLANT
BUR PRECISION FLUTE 6.0 (BURR) ×3 IMPLANT
CANISTER SUCT 3000ML PPV (MISCELLANEOUS) ×3 IMPLANT
CLOSURE WOUND 1/2 X4 (GAUZE/BANDAGES/DRESSINGS) ×1
DRAPE LAPAROTOMY 100X72X124 (DRAPES) ×3 IMPLANT
DRAPE MICROSCOPE LEICA (MISCELLANEOUS) ×3 IMPLANT
DRAPE POUCH INSTRU U-SHP 10X18 (DRAPES) ×3 IMPLANT
DRAPE SURG 17X23 STRL (DRAPES) ×12 IMPLANT
ELECT BLADE 4.0 EZ CLEAN MEGAD (MISCELLANEOUS) ×3
ELECT REM PT RETURN 9FT ADLT (ELECTROSURGICAL) ×3
ELECTRODE BLDE 4.0 EZ CLN MEGD (MISCELLANEOUS) ×1 IMPLANT
ELECTRODE REM PT RTRN 9FT ADLT (ELECTROSURGICAL) ×1 IMPLANT
GAUZE SPONGE 4X4 12PLY STRL (GAUZE/BANDAGES/DRESSINGS) ×3 IMPLANT
GAUZE SPONGE 4X4 16PLY XRAY LF (GAUZE/BANDAGES/DRESSINGS) IMPLANT
GLOVE BIO SURGEON STRL SZ8 (GLOVE) ×3 IMPLANT
GLOVE BIO SURGEON STRL SZ8.5 (GLOVE) ×3 IMPLANT
GLOVE EXAM NITRILE LRG STRL (GLOVE) IMPLANT
GLOVE EXAM NITRILE MD LF STRL (GLOVE) IMPLANT
GLOVE EXAM NITRILE XL STR (GLOVE) IMPLANT
GLOVE EXAM NITRILE XS STR PU (GLOVE) IMPLANT
GOWN STRL REUS W/ TWL LRG LVL3 (GOWN DISPOSABLE) IMPLANT
GOWN STRL REUS W/ TWL XL LVL3 (GOWN DISPOSABLE) ×1 IMPLANT
GOWN STRL REUS W/TWL 2XL LVL3 (GOWN DISPOSABLE) IMPLANT
GOWN STRL REUS W/TWL LRG LVL3 (GOWN DISPOSABLE)
GOWN STRL REUS W/TWL XL LVL3 (GOWN DISPOSABLE) ×2
KIT BASIN OR (CUSTOM PROCEDURE TRAY) ×3 IMPLANT
KIT ROOM TURNOVER OR (KITS) ×3 IMPLANT
NEEDLE HYPO 21X1.5 SAFETY (NEEDLE) IMPLANT
NEEDLE HYPO 22GX1.5 SAFETY (NEEDLE) ×3 IMPLANT
NS IRRIG 1000ML POUR BTL (IV SOLUTION) ×3 IMPLANT
PACK LAMINECTOMY NEURO (CUSTOM PROCEDURE TRAY) ×3 IMPLANT
PAD ARMBOARD 7.5X6 YLW CONV (MISCELLANEOUS) ×9 IMPLANT
PATTIES SURGICAL .5 X1 (DISPOSABLE) IMPLANT
RUBBERBAND STERILE (MISCELLANEOUS) ×6 IMPLANT
SPONGE SURGIFOAM ABS GEL SZ50 (HEMOSTASIS) ×3 IMPLANT
STRIP CLOSURE SKIN 1/2X4 (GAUZE/BANDAGES/DRESSINGS) ×2 IMPLANT
SUT VIC AB 1 CT1 18XBRD ANBCTR (SUTURE) ×1 IMPLANT
SUT VIC AB 1 CT1 8-18 (SUTURE) ×2
SUT VIC AB 2-0 CP2 18 (SUTURE) ×3 IMPLANT
TAPE CLOTH SURG 4X10 WHT LF (GAUZE/BANDAGES/DRESSINGS) ×3 IMPLANT
TOWEL OR 17X24 6PK STRL BLUE (TOWEL DISPOSABLE) ×3 IMPLANT
TOWEL OR 17X26 10 PK STRL BLUE (TOWEL DISPOSABLE) ×3 IMPLANT
WATER STERILE IRR 1000ML POUR (IV SOLUTION) ×3 IMPLANT

## 2014-11-03 NOTE — Transfer of Care (Signed)
Immediate Anesthesia Transfer of Care Note  Patient: Geoffrey Kelly  Procedure(s) Performed: Procedure(s) with comments: Left Lumbar Four, Lumbar Five Diskectomy (Left) - Left L45 diskectomy  Patient Location: PACU  Anesthesia Type:General  Level of Consciousness: sedated  Airway & Oxygen Therapy: Patient Spontanous Breathing and Patient connected to nasal cannula oxygen  Post-op Assessment: Report given to RN and Post -op Vital signs reviewed and stable  Post vital signs: stable  Last Vitals:  Filed Vitals:   11/03/14 1036  BP: 117/75  Pulse: 62  Temp: 36.3 C  Resp: 20    Complications: No apparent anesthesia complications

## 2014-11-03 NOTE — Anesthesia Preprocedure Evaluation (Addendum)
Anesthesia Evaluation  Patient identified by MRN, date of birth, ID band Patient awake    Reviewed: Allergy & Precautions, NPO status , Patient's Chart, lab work & pertinent test results, reviewed documented beta blocker date and time   Airway Mallampati: II  TM Distance: >3 FB Neck ROM: Full    Dental  (+) Teeth Intact   Pulmonary neg pulmonary ROS,  breath sounds clear to auscultation        Cardiovascular Pt. on home beta blockers + CAD and + Past MI Rhythm:Regular Rate:Normal  Normal Stress Test 10/2014. Cardiologist Note in Care Everywhere and Chart. OK to proceed with Surgery.    Neuro/Psych negative neurological ROS  negative psych ROS   GI/Hepatic negative GI ROS, Neg liver ROS,   Endo/Other  negative endocrine ROS  Renal/GU negative Renal ROS  negative genitourinary   Musculoskeletal  (+) Arthritis -, Osteoarthritis,    Abdominal   Peds negative pediatric ROS (+)  Hematology   Anesthesia Other Findings   Reproductive/Obstetrics                            Lab Results  Component Value Date   WBC 6.7 11/02/2014   HGB 13.9 11/02/2014   HCT 42.8 11/02/2014   MCV 87.7 11/02/2014   PLT 247 11/02/2014   Lab Results  Component Value Date   CREATININE 0.85 11/02/2014   BUN 10 11/02/2014   NA 136 11/02/2014   K 4.3 11/02/2014   CL 103 11/02/2014   CO2 27 11/02/2014   No results found for: INR, PROTIME  Anesthesia Physical Anesthesia Plan  ASA: II  Anesthesia Plan: General   Post-op Pain Management:    Induction: Intravenous  Airway Management Planned: Oral ETT  Additional Equipment:   Intra-op Plan:   Post-operative Plan: Extubation in OR  Informed Consent: I have reviewed the patients History and Physical, chart, labs and discussed the procedure including the risks, benefits and alternatives for the proposed anesthesia with the patient or authorized representative  who has indicated his/her understanding and acceptance.   Dental advisory given  Plan Discussed with: CRNA  Anesthesia Plan Comments: (Normal Stress Test 10/2014. Cardiologist Note in Care Everywhere and Chart. OK to proceed with Surgery.  )       Anesthesia Quick Evaluation

## 2014-11-03 NOTE — Progress Notes (Signed)
Subjective:  The patient is somnolent but easily arousable. He is in no apparent distress. He looks well.  Objective: Vital signs in last 24 hours: Temp:  [97.3 F (36.3 C)-97.5 F (36.4 C)] 97.5 F (36.4 C) (09/01 1605) Pulse Rate:  [62] 62 (09/01 1036) Resp:  [20] 20 (09/01 1036) BP: (117)/(75) 117/75 mmHg (09/01 1036) SpO2:  [98 %] 98 % (09/01 1036) Weight:  [73.029 kg (161 lb)] 73.029 kg (161 lb) (09/01 1036)  Intake/Output from previous day:   Intake/Output this shift: Total I/O In: 2100 [I.V.:2100] Out: -   Physical exam the patient is somnolent but arousable. He is moving his lower extremities well.  Lab Results:  Recent Labs  11/02/14 1107  WBC 6.7  HGB 13.9  HCT 42.8  PLT 247   BMET  Recent Labs  11/02/14 1107  NA 136  K 4.3  CL 103  CO2 27  GLUCOSE 101*  BUN 10  CREATININE 0.85  CALCIUM 9.6    Studies/Results: Dg Lumbar Spine 1 View  11/03/2014   CLINICAL DATA:  Left L4-5 discectomy  EXAM: LUMBAR SPINE - 1 VIEW  COMPARISON:  10/03/2014 lumbar spine MRI.  FINDINGS: Intraoperative portable cross-table radiograph of the lumbar spine. This report assumes 5 non rib-bearing lumbar vertebrae. Posterior approach surgical marking device terminates in the posterior soft tissues at the level of the lower L5 vertebral body. There is a mild-to-moderate compression deformity of the T12 vertebral body, with approximately 40% anterior loss of vertebral body height, unchanged since 10/03/2014. Lumbar vertebral body heights are preserved. Lumbar disc heights are relatively preserved. Mild spondylosis is present throughout the lumbar spine.  IMPRESSION: Posterior approach surgical marking device terminates in the posterior soft tissues at the lower L5 vertebral body level.   Electronically Signed   By: Delbert Phenix M.D.   On: 11/03/2014 15:52    Assessment/Plan: The patient is doing well.      Rosaland Shiffman D 11/03/2014, 4:15 PM

## 2014-11-03 NOTE — H&P (Signed)
Subjective: The patient is a 64 year old white male who suffered a knee injury a few months ago. While he was recovering from his knee injury he began having pain in his left buttocks and left leg. He has failed medical management and was worked up with a lumbar MRI. This demonstrated a herniated disc at L4-5 on the left. I discussed the various treatment options with the patient including surgery. He has decided proceed with a left L4-5 discectomy.   Past Medical History  Diagnosis Date  . Coronary artery disease   . Myocardial infarction     2013  . Arthritis   . Headache   . Fluid in knee     Past Surgical History  Procedure Laterality Date  . Cardiac surgery      stent placement 2013  . Cardiac catheterization      2013    No Known Allergies  Social History  Substance Use Topics  . Smoking status: Never Smoker   . Smokeless tobacco: Not on file  . Alcohol Use: No    History reviewed. No pertinent family history. Prior to Admission medications   Medication Sig Start Date End Date Taking? Authorizing Provider  aspirin 81 MG tablet Take 81 mg by mouth daily.   Yes Historical Provider, MD  metoprolol tartrate (LOPRESSOR) 25 MG tablet Take 25 mg by mouth 2 (two) times daily.   Yes Historical Provider, MD  pravastatin (PRAVACHOL) 80 MG tablet Take 80 mg by mouth daily.   Yes Historical Provider, MD  senna-docusate (SENOKOT-S) 8.6-50 MG per tablet Take 1 tablet by mouth at bedtime as needed for mild constipation. 10/03/14  Yes Nicole Pisciotta, PA-C     Review of Systems  Positive ROS: As above  All other systems have been reviewed and were otherwise negative with the exception of those mentioned in the HPI and as above.  Objective: Vital signs in last 24 hours: Temp:  [97.3 F (36.3 C)] 97.3 F (36.3 C) (09/01 1036) Pulse Rate:  [62] 62 (09/01 1036) Resp:  [20] 20 (09/01 1036) BP: (117)/(75) 117/75 mmHg (09/01 1036) SpO2:  [98 %] 98 % (09/01 1036) Weight:  [73.029 kg  (161 lb)] 73.029 kg (161 lb) (09/01 1036)  General Appearance: Alert, cooperative, no distress, Head: Normocephalic, without obvious abnormality, atraumatic Eyes: PERRL, conjunctiva/corneas clear, EOM's intact,    Ears: Normal  Throat: Normal  Neck: Supple, symmetrical, trachea midline, no adenopathy; thyroid: No enlargement/tenderness/nodules; no carotid bruit or JVD Back: Symmetric, no curvature, ROM normal, no CVA tenderness Lungs: Clear to auscultation bilaterally, respirations unlabored Heart: Regular rate and rhythm, no murmur, rub or gallop Abdomen: Soft, non-tender,, no masses, no organomegaly Extremities: Extremities normal, atraumatic, no cyanosis or edema Pulses: 2+ and symmetric all extremities Skin: Skin color, texture, turgor normal, no rashes or lesions  NEUROLOGIC:   Mental status: alert and oriented, no aphasia, good attention span, Fund of knowledge/ memory ok Motor Exam - grossly normal except the patient has weakness in his left quadricep and extensor hallucis longus/dorsiflexor Sensory Exam - grossly normal Reflexes:  Coordination - grossly normal Gait - grossly normal Balance - grossly normal Cranial Nerves: I: smell Not tested  II: visual acuity  OS: Normal  OD: Normal   II: visual fields Full to confrontation  II: pupils Equal, round, reactive to light  III,VII: ptosis None  III,IV,VI: extraocular muscles  Full ROM  V: mastication Normal  V: facial light touch sensation  Normal  V,VII: corneal reflex  Present  VII:  facial muscle function - upper  Normal  VII: facial muscle function - lower Normal  VIII: hearing Not tested  IX: soft palate elevation  Normal  IX,X: gag reflex Present  XI: trapezius strength  5/5  XI: sternocleidomastoid strength 5/5  XI: neck flexion strength  5/5  XII: tongue strength  Normal    Data Review Lab Results  Component Value Date   WBC 6.7 11/02/2014   HGB 13.9 11/02/2014   HCT 42.8 11/02/2014   MCV 87.7 11/02/2014    PLT 247 11/02/2014   Lab Results  Component Value Date   NA 136 11/02/2014   K 4.3 11/02/2014   CL 103 11/02/2014   CO2 27 11/02/2014   BUN 10 11/02/2014   CREATININE 0.85 11/02/2014   GLUCOSE 101* 11/02/2014   No results found for: INR, PROTIME  Assessment/Plan: Left L4-5 herniated disc, lumbago, lumbar radiculopathy: I have discussed the situation with the patient. I have reviewed his MR scan with him and pointed out the abnormalities. We have discussed the various treatment options including surgery. I have described the surgical treatment option of a left L4-5 discectomy. I have shown him surgical models. We have discussed the risks, benefits, alternatives, and likelihood of achieving goals with surgery. I have answered all the patient's questions. He has decided to proceed with surgery.   Luwanna Brossman D 11/03/2014 2:01 PM

## 2014-11-03 NOTE — Anesthesia Procedure Notes (Signed)
Procedure Name: Intubation Date/Time: 11/03/2014 2:12 PM Performed by: Adonis Housekeeper Pre-anesthesia Checklist: Patient identified, Emergency Drugs available, Suction available and Patient being monitored Patient Re-evaluated:Patient Re-evaluated prior to inductionOxygen Delivery Method: Circle system utilized Preoxygenation: Pre-oxygenation with 100% oxygen Intubation Type: IV induction Ventilation: Mask ventilation without difficulty Laryngoscope Size: Mac and 4 Grade View: Grade III Tube type: Oral Tube size: 7.0 mm Number of attempts: 1 Airway Equipment and Method: Stylet Placement Confirmation: ETT inserted through vocal cords under direct vision,  positive ETCO2 and breath sounds checked- equal and bilateral Secured at: 22 cm Tube secured with: Tape Dental Injury: Teeth and Oropharynx as per pre-operative assessment

## 2014-11-03 NOTE — Anesthesia Postprocedure Evaluation (Signed)
  Anesthesia Post-op Note  Patient: Geoffrey Kelly  Procedure(s) Performed: Procedure(s) with comments: Left Lumbar Four, Lumbar Five Diskectomy (Left) - Left L45 diskectomy  Patient Location: PACU  Anesthesia Type:General  Level of Consciousness: awake  Airway and Oxygen Therapy: Patient Spontanous Breathing  Post-op Pain: mild  Post-op Assessment: Post-op Vital signs reviewed   LLE Sensation: No numbness   RLE Sensation: No numbness      Post-op Vital Signs: Reviewed  Last Vitals:  Filed Vitals:   11/03/14 1730  BP:   Pulse: 61  Temp:   Resp: 14    Complications: No apparent anesthesia complications

## 2014-11-03 NOTE — Op Note (Signed)
Brief history: The patient is a 64 year old white male who is complaining of back and left leg pain consistent with a lumbar radiculopathy. He has failed medical management and was worked up with a lumbar MRI which demonstrated a herniated disc at L4-5 on the left. I discussed the various treatment options including surgery. The patient has weighed the risks, benefits, and alternative surgery and decided to proceed with a left L4-5 discectomy.  Preoperative diagnosis: L4-5 herniated disc, lumbago, lumbar radiculopathy  Postoperative diagnosis: The same  Procedure: Left L4 hemilaminectomy for left L4-5 Intervertebral discectomy compressing the left L4 and L5 nerve roots using micro-dissection  Surgeon: Dr. Delma Officer  Asst.: Dr. Maeola Harman  Anesthesia: Gen. endotracheal  Estimated blood loss: Minimal  Drains: None  Complications: None  Description of procedure: The patient was brought to the operating room by the anesthesia team. General endotracheal anesthesia was induced. The patient was turned to the prone position on the Wilson frame. The patient's lumbosacral region was then prepared with Betadine scrub and Betadine solution. Sterile drapes were applied.  I then injected the area to be incised with Marcaine with epinephrine solution. I then used a scalpel to make a linear midline incision over the L4-5 intervertebral disc space. I then used electrocautery to perform a left sided subperiosteal dissection exposing the spinous process and lamina of L4 and L5. We obtained intraoperative radiograph to confirm our location. I then inserted the [facility] retractor for exposure.  We then brought the operative microscope into the field. Under its magnification and illumination we completed the microdissection. I used a high-speed drill to perform a laminotomy at L4-5 on the left. I then used a Kerrison punches to widen the laminotomy and removed the ligamentum flavum at L4-5 on the left. We  then used microdissection to free up the thecal sac and the left L4 and L5 nerve root from the epidural tissue. I then used a Kerrison punch to perform a foraminotomy at about the left L4 and L5 nerve root. We then using the nerve root retractor to gently retract the thecal sac and the L5 nerve root medially. This exposed the intervertebral disc. We identified the ruptured disc and remove it with the pituitary forceps. I inspected the intervertebral disc at L4-5. There were no large holes in the annulus nor impending herniations. We did not perform an intervertebral discectomy  I then palpated along the ventral surface of the thecal sac and along exit route of the left L4 and left L5 nerve root and noted that the neural structures were well decompressed. This completed the decompression.  We then obtained hemostasis using bipolar electrocautery. We irrigated the wound out with bacitracin solution. We then removed the retractor. We then reapproximated the patient's thoracolumbar fascia with interrupted #1 Vicryl suture. We then reapproximated the patient's subcutaneous tissue with interrupted 2-0 Vicryl suture. We then reapproximated patient's skin with Steri-Strips and benzoin. The was then coated with bacitracin ointment. The drapes were removed. The patient was subsequently returned to the supine position where they were extubated by the anesthesia team. The patient was then transported to the postanesthesia care unit in stable condition. All sponge instrument and needle counts were reportedly correct at the end of this case.

## 2014-11-04 ENCOUNTER — Encounter (HOSPITAL_COMMUNITY): Payer: Self-pay | Admitting: Neurosurgery

## 2014-11-04 DIAGNOSIS — M5116 Intervertebral disc disorders with radiculopathy, lumbar region: Secondary | ICD-10-CM | POA: Diagnosis not present

## 2014-11-04 MED ORDER — DOCUSATE SODIUM 100 MG PO CAPS: 100.0000 mg | ORAL_CAPSULE | Freq: Two times a day (BID) | ORAL | Status: DC

## 2014-11-04 MED ORDER — OXYCODONE-ACETAMINOPHEN 10-325 MG PO TABS: 1.0000 | ORAL_TABLET | ORAL | Status: DC | PRN

## 2014-11-04 MED ORDER — CYCLOBENZAPRINE HCL 10 MG PO TABS: 10.0000 mg | ORAL_TABLET | Freq: Three times a day (TID) | ORAL | Status: DC | PRN

## 2014-11-04 NOTE — Progress Notes (Signed)
Pt and brother given D/C instructions with Rx's, verbal understanding was provided. Pt's incision is clean and dry with no sign of infection. Pt's IV was removed prior to D/C. Pt D/C'd home via wheelchair @ 1005 per MD order. Pt is stable @ D/C and has no other needs at this time. Rema Fendt, RN

## 2014-11-04 NOTE — Evaluation (Addendum)
Physical Therapy Evaluation Patient Details Name: Geoffrey Kelly MRN: [id number] DOB: Jul 08, 1950 Today's Date: 11/04/2014   History of Present Illness  Pt is a 65 y/o male with a with a PMH of a R knee injury a few months ago. While he was recovering from his knee injury he began having pain in his L buttocks and L leg. MRI revealed herniated disc L4-5. Pt is now s/p TLIF L4-5 on 11/03/14.  Clinical Impression  Pt admitted with above diagnosis. Pt currently with functional limitations due to the deficits listed below (see PT Problem List). At the time of PT eval pt was able to perform transfers and ambulation with supervision for safety and a RW. Feel pt could benefit from outpatient PT when able per post-op protocol for R knee and LLE residual weakness. Pt will benefit from skilled PT to increase their independence and safety with mobility to allow discharge to the venue listed below.       Follow Up Recommendations Outpatient PT;Supervision - Intermittent    Equipment Recommendations  None recommended by PT    Recommendations for Other Services       Precautions / Restrictions Precautions Precautions: Fall Precaution Comments: R knee difficulty Restrictions Weight Bearing Restrictions: No      Mobility  Bed Mobility Overal bed mobility: Modified Independent             General bed mobility comments: Pt was instructed in log roll technique and was able to perform at a mod I level.  Transfers Overall transfer level: Needs assistance Equipment used: None Transfers: Sit to/from Stand Sit to Stand: Supervision         General transfer comment: Supervision for safety. Pt was able to gain and maintain standing balance well.   Ambulation/Gait Ambulation/Gait assistance: Min assist;Supervision Ambulation Distance (Feet): 400 Feet Assistive device: Rolling walker (2 wheeled);1 person hand held assist Gait Pattern/deviations: Step-through pattern;Decreased stride length;Trunk  flexed Gait velocity: Decreased Gait velocity interpretation: Below normal speed for age/gender General Gait Details: With HHA pt required min A for safe ambulation. With walker, pt progressed to a supervision level and showed improved posture. Occasional VC's for posture were provided throughout gait training.   Stair Training: Pt was able to negotiate 4 stairs ascending and descending with VC's for sequencing and safety awareness. Pt required close guard for safety but no real physical assistance.   Stairs            Wheelchair Mobility    Modified Rankin (Stroke Patients Only)       Balance Overall balance assessment: Needs assistance Sitting-balance support: Feet supported;No upper extremity supported Sitting balance-Leahy Scale: Fair     Standing balance support: No upper extremity supported;During functional activity Standing balance-Leahy Scale: Poor Standing balance comment: Assist for dynamic standing activity.                              Pertinent Vitals/Pain Pain Assessment: 0-10 Pain Score: 1  Pain Location: Incision Pain Descriptors / Indicators: Operative site guarding Pain Intervention(s): Limited activity within patient's tolerance;Monitored during session;Repositioned    Home Living Family/patient expects to be discharged to:: Private residence Living Arrangements: Parent Available Help at Discharge: Family;Available 24 hours/day Type of Home: House Home Access: Stairs to enter Entrance Stairs-Rails: Right (grab bar) Entrance Stairs-Number of Steps: 2 Home Layout: One level Home Equipment: Walker - 2 wheels;Crutches;Shower seat      Prior Function Level of Independence:  Independent with assistive device(s)         Comments: Used crutches PTA     Hand Dominance   Dominant Hand: Right    Extremity/Trunk Assessment   Upper Extremity Assessment: Overall WFL for tasks assessed           Lower Extremity Assessment: RLE  deficits/detail;LLE deficits/detail RLE Deficits / Details: Limited knee flexion and pain with weight bearing from previous injury LLE Deficits / Details: General weakness consistent with radiating nerve pain from back  Cervical / Trunk Assessment: Normal  Communication   Communication: No difficulties  Cognition Arousal/Alertness: Awake/alert Behavior During Therapy: WFL for tasks assessed/performed Overall Cognitive Status: Within Functional Limits for tasks assessed                      General Comments      Exercises        Assessment/Plan    PT Assessment Patient needs continued PT services  PT Diagnosis Difficulty walking;Acute pain   PT Problem List Decreased strength;Decreased range of motion;Decreased activity tolerance;Decreased balance;Decreased mobility;Decreased knowledge of use of DME;Decreased safety awareness;Decreased knowledge of precautions;Pain  PT Treatment Interventions DME instruction;Gait training;Stair training;Functional mobility training;Therapeutic activities;Therapeutic exercise;Neuromuscular re-education;Patient/family education   PT Goals (Current goals can be found in the Care Plan section) Acute Rehab PT Goals Patient Stated Goal: Home today PT Goal Formulation: With patient/family Time For Goal Achievement: 11/11/14 Potential to Achieve Goals: Good    Frequency Min 5X/week   Barriers to discharge        Co-evaluation               End of Session Equipment Utilized During Treatment: Gait belt Activity Tolerance: Patient tolerated treatment well Patient left: in chair;with call bell/phone within reach;with family/visitor present Nurse Communication: Mobility status    Functional Assessment Tool Used: Clinical judgement Functional Limitation: Mobility: Walking and moving around Mobility: Walking and Moving Around Current Status [phone]): At least 1 percent but less than 20 percent impaired, limited or restricted Mobility:  Walking and Moving Around Goal Status [phone]): At least 1 percent but less than 20 percent impaired, limited or restricted    Time: 0813-0837 PT Time Calculation (min) (ACUTE ONLY): 24 min   Charges:   PT Evaluation $Initial PT Evaluation Tier I: 1 Procedure PT Treatments $Gait Training: 8-22 mins   PT G Codes:   PT G-Codes **NOT FOR INPATIENT CLASS** Functional Assessment Tool Used: Clinical judgement Functional Limitation: Mobility: Walking and moving around Mobility: Walking and Moving Around Current Status ([id number]): At least 1 percent but less than 20 percent impaired, limited or restricted Mobility: Walking and Moving Around Goal Status [phone]): At least 1 percent but less than 20 percent impaired, limited or restricted    Conni Slipper 11/04/2014, 9:45 AM   Conni Slipper, PT, DPT Acute Rehabilitation Services Pager: [phone]

## 2014-11-04 NOTE — Discharge Summary (Signed)
Physician Discharge Summary  Patient ID: Geoffrey Kelly MRN: [id number] DOB/AGE: [birth year] 64 y.o.  Admit date: 11/03/2014 Discharge date: 11/04/2014  Admission Diagnoses: Left L4-5 herniated disc, lumbago, lumbar radiculopathy  Discharge Diagnoses: The same Active Problems:   Lumbar herniated disc   Discharged Condition: good  Hospital Course: I performed a left L4-5 discectomy on the patient on 11/03/2014. The surgery went well.  The patient's postoperative course was unremarkable. On postoperative day #1 he requested discharge to home. The patient, and his brother, was given oral and written discharge instructions. All their questions were answered.  Consults: None Significant Diagnostic Studies: None Treatments: Left L4-5 discectomy using microdissection Discharge Exam: Blood pressure 106/65, pulse 54, temperature 97.9 F (36.6 C), temperature source Oral, resp. rate 16, height  (1.753 m), weight 73.029 kg (161 lb), SpO2 99 %. The patient is alert and pleasant. He looks well. He is moving his lower extremities well.  Disposition: Home  Discharge Instructions    Call MD for:  difficulty breathing, headache or visual disturbances    Complete by:  As directed      Call MD for:  extreme fatigue    Complete by:  As directed      Call MD for:  hives    Complete by:  As directed      Call MD for:  persistant dizziness or light-headedness    Complete by:  As directed      Call MD for:  persistant nausea and vomiting    Complete by:  As directed      Call MD for:  redness, tenderness, or signs of infection (pain, swelling, redness, odor or green/yellow discharge around incision site)    Complete by:  As directed      Call MD for:  severe uncontrolled pain    Complete by:  As directed      Call MD for:  temperature >100.4    Complete by:  As directed      Diet - low sodium heart healthy    Complete by:  As directed      Discharge instructions    Complete by:  As directed    Call [phone] for a followup appointment. Take a stool softener while you are using pain medications.     Driving Restrictions    Complete by:  As directed   Do not drive for 2 weeks.     Increase activity slowly    Complete by:  As directed      Lifting restrictions    Complete by:  As directed   Do not lift more than 5 pounds. No excessive bending or twisting.     May shower / Bathe    Complete by:  As directed   He may shower after the pain she is removed 3 days after surgery. Leave the incision alone.     Remove dressing in 48 hours    Complete by:  As directed   Your stitches are under the scan and will dissolve by themselves. The Steri-Strips will fall off after you take a few showers. Do not rub back or pick at the wound, Leave the wound alone.            Medication List    TAKE these medications        aspirin 81 MG tablet  Take 81 mg by mouth daily.     cyclobenzaprine 10 MG tablet  Commonly known as:  FLEXERIL  Take 1  tablet (10 mg total) by mouth 3 (three) times daily as needed for muscle spasms.     docusate sodium 100 MG capsule  Commonly known as:  COLACE  Take 1 capsule (100 mg total) by mouth 2 (two) times daily.     metoprolol tartrate 25 MG tablet  Commonly known as:  LOPRESSOR  Take 25 mg by mouth 2 (two) times daily.     oxyCODONE-acetaminophen 10-325 MG per tablet  Commonly known as:  PERCOCET  Take 1 tablet by mouth every 4 (four) hours as needed for pain.     pravastatin 80 MG tablet  Commonly known as:  PRAVACHOL  Take 80 mg by mouth daily.     senna-docusate 8.6-50 MG per tablet  Commonly known as:  Senokot-S  Take 1 tablet by mouth at bedtime as needed for mild constipation.         SignedTressie Stalker D 11/04/2014, 8:04 AM

## 2014-11-04 NOTE — Discharge Instructions (Signed)

## 2015-01-25 ENCOUNTER — Other Ambulatory Visit: Payer: Self-pay | Admitting: Unknown Physician Specialty

## 2015-01-25 DIAGNOSIS — M25532 Pain in left wrist: Secondary | ICD-10-CM

## 2015-02-15 ENCOUNTER — Ambulatory Visit: Payer: 59

## 2015-03-28 ENCOUNTER — Other Ambulatory Visit
Admission: RE | Admit: 2015-03-28 | Discharge: 2015-03-28 | Disposition: A | Discharge status: Home / Self Care | Payer: Self-pay | Source: Ambulatory Visit | Attending: Unknown Physician Specialty | Admitting: Unknown Physician Specialty

## 2015-03-28 DIAGNOSIS — M25561 Pain in right knee: Secondary | ICD-10-CM | POA: Insufficient documentation

## 2015-03-28 LAB — SYNOVIAL CELL COUNT + DIFF, W/ CRYSTALS
Crystals, Fluid: NONE SEEN
EOSINOPHILS-SYNOVIAL: 0 %
Lymphocytes-Synovial Fld: 44 %
Monocyte-Macrophage-Synovial Fluid: 17 %
Neutrophil, Synovial: 39 %
OTHER CELLS-SYN: 0
WBC, Synovial: 4838 /mm3 — ABNORMAL HIGH (ref 0–200)

## 2015-04-01 LAB — BODY FLUID CULTURE: CULTURE: NO GROWTH

## 2015-07-27 ENCOUNTER — Other Ambulatory Visit: Payer: Self-pay | Admitting: Unknown Physician Specialty

## 2015-07-27 DIAGNOSIS — G8929 Other chronic pain: Secondary | ICD-10-CM

## 2015-07-27 DIAGNOSIS — M25512 Pain in left shoulder: Principal | ICD-10-CM

## 2015-08-01 ENCOUNTER — Other Ambulatory Visit (HOSPITAL_COMMUNITY): Payer: Self-pay | Admitting: Unknown Physician Specialty

## 2015-08-01 DIAGNOSIS — M25512 Pain in left shoulder: Principal | ICD-10-CM

## 2015-08-01 DIAGNOSIS — G8929 Other chronic pain: Secondary | ICD-10-CM

## 2015-08-07 ENCOUNTER — Ambulatory Visit (HOSPITAL_COMMUNITY)
Admission: RE | Admit: 2015-08-07 | Discharge: 2015-08-07 | Disposition: A | Discharge status: Home / Self Care | Payer: BLUE CROSS/BLUE SHIELD | Source: Ambulatory Visit | Attending: Unknown Physician Specialty | Admitting: Unknown Physician Specialty

## 2015-08-07 DIAGNOSIS — M25512 Pain in left shoulder: Secondary | ICD-10-CM | POA: Insufficient documentation

## 2015-08-07 DIAGNOSIS — M7582 Other shoulder lesions, left shoulder: Secondary | ICD-10-CM | POA: Insufficient documentation

## 2015-08-07 DIAGNOSIS — G8929 Other chronic pain: Secondary | ICD-10-CM | POA: Diagnosis not present

## 2016-04-12 IMAGING — CR DG LUMBAR SPINE 1V
1 series · 1 of 1 positions shown · non-contrast
Comparison: 10/03/2014 lumbar spine MRI.

CLINICAL DATA: Left L4-5 discectomy

EXAM:
LUMBAR SPINE - 1 VIEW

[lateral]
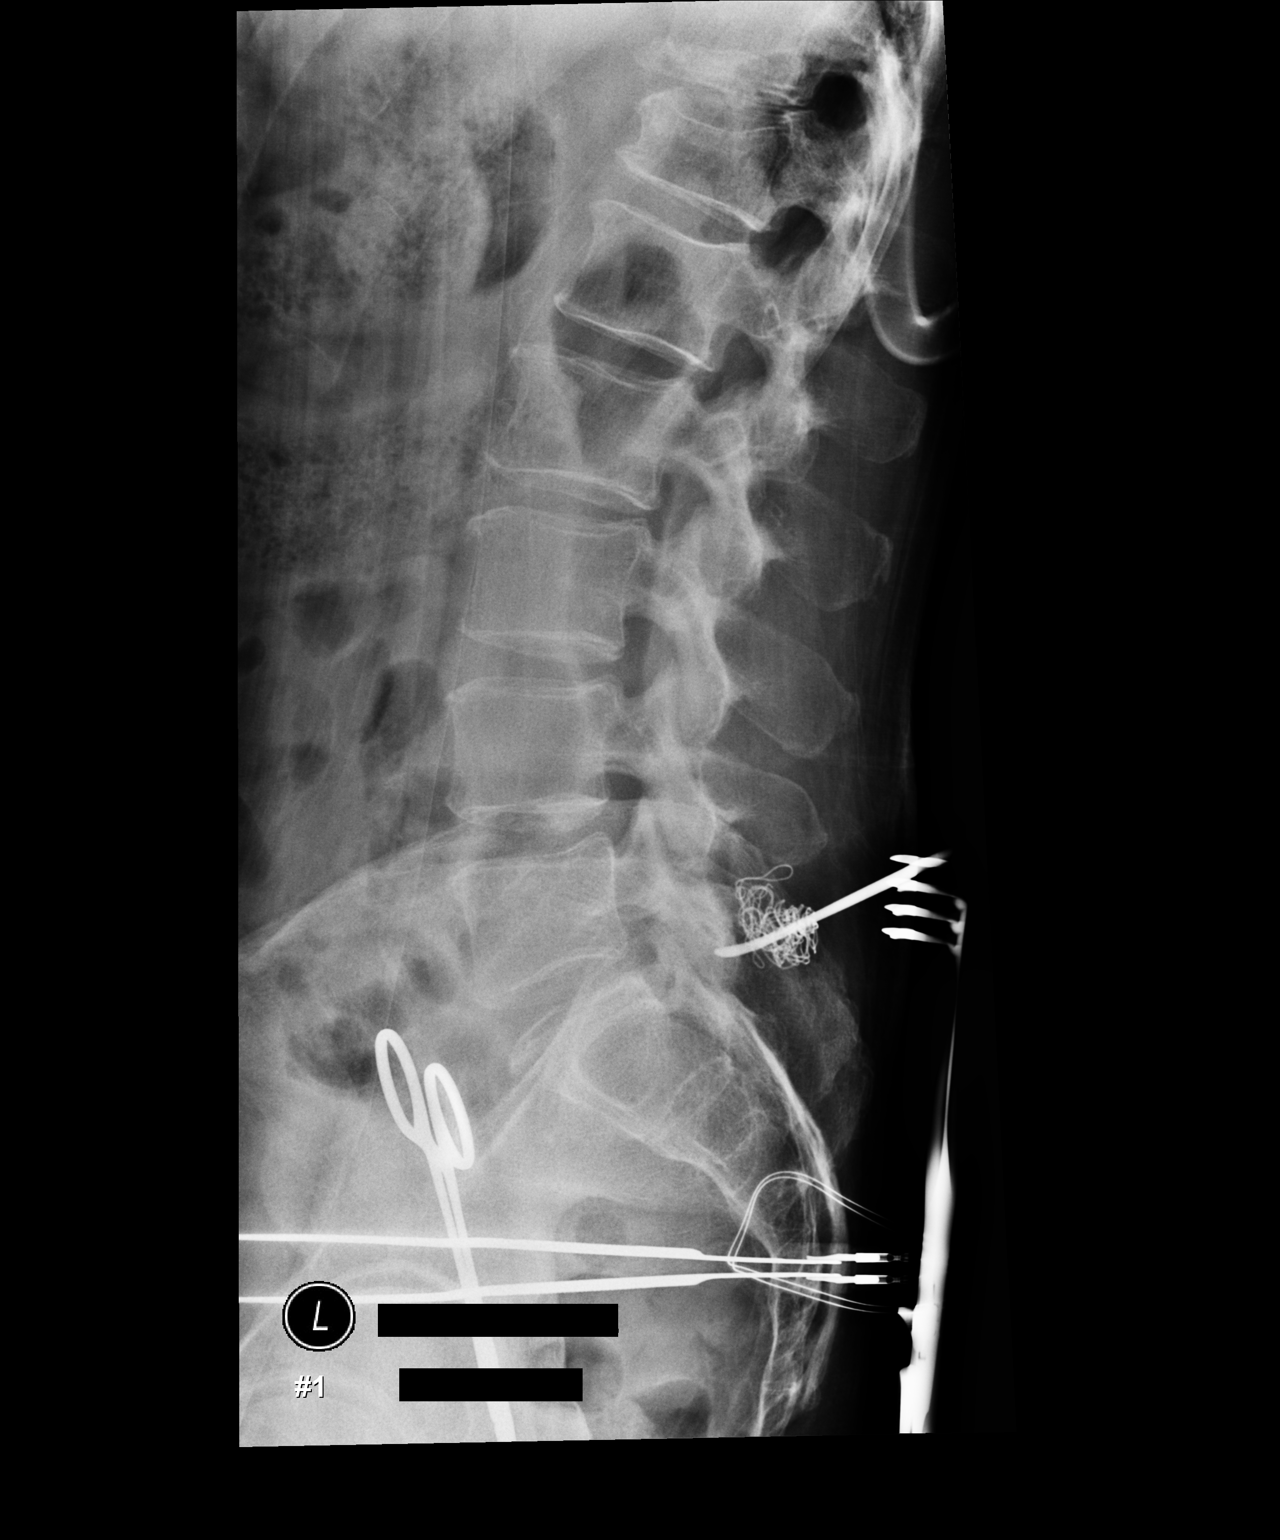

[1 of 1 positions shown; findings below may reference images not displayed]

FINDINGS: Intraoperative portable cross-table radiograph of the lumbar spine.
This report assumes 5 non rib-bearing lumbar vertebrae. Posterior
approach surgical marking device terminates in the posterior soft
tissues at the level of the lower L5 vertebral body. There is a
mild-to-moderate compression deformity of the T12 vertebral body,
with approximately 40% anterior loss of vertebral body height,
unchanged since 10/03/2014. Lumbar vertebral body heights are
preserved. Lumbar disc heights are relatively preserved. Mild
spondylosis is present throughout the lumbar spine.
IMPRESSION: Posterior approach surgical marking device terminates in the
posterior soft tissues at the lower L5 vertebral body level.

## 2017-09-10 ENCOUNTER — Telehealth: Payer: Self-pay | Admitting: Cardiology

## 2017-09-10 NOTE — Telephone Encounter (Signed)
Pt c/o of Chest Pain: STAT if CP now or developed within 24 hours  1. Are you having CP right now? Yes   2. Are you experiencing any other symptoms (ex. SOB, nausea, vomiting, sweating)? No other symptoms  3. How long have you been experiencing CP? Off on for 1 mo  4. Is your CP continuous or coming and going? Coming and going  5. Have you taken Nitroglycerin? *no he does not have  Patient has not been seen by Dr Bing Matter in the past 2 years ?

## 2017-09-10 NOTE — Telephone Encounter (Signed)
Spoke with patient regarding his symptoms. Patient states that he has not been having chest pain just discomfort and stated he just knows he is not "feeling well." Per Dr. Bing Matter this patient was added to his schedule tomorrow, patient was informed.

## 2017-09-11 ENCOUNTER — Ambulatory Visit (INDEPENDENT_AMBULATORY_CARE_PROVIDER_SITE_OTHER): Payer: Medicare Other | Admitting: Cardiology

## 2017-09-11 ENCOUNTER — Encounter: Payer: Self-pay | Admitting: Cardiology

## 2017-09-11 VITALS — BP 128/70 | HR 54 | Ht 69.0 in | Wt 177.4 lb

## 2017-09-11 DIAGNOSIS — E785 Hyperlipidemia, unspecified: Secondary | ICD-10-CM | POA: Diagnosis not present

## 2017-09-11 MED ORDER — ASPIRIN 81 MG PO TABS: 81.0000 mg | ORAL_TABLET | Freq: Every day | ORAL | 0 refills | Status: AC

## 2017-09-11 MED ORDER — NITROGLYCERIN 0.4 MG SL SUBL: 0.4000 mg | SUBLINGUAL_TABLET | SUBLINGUAL | 11 refills | Status: DC | PRN

## 2017-09-11 MED ORDER — RANOLAZINE ER 500 MG PO TB12: 500.0000 mg | ORAL_TABLET | Freq: Two times a day (BID) | ORAL | 2 refills | Status: DC

## 2017-09-11 MED ORDER — ATORVASTATIN CALCIUM 20 MG PO TABS: 20.0000 mg | ORAL_TABLET | Freq: Every day | ORAL | 2 refills | Status: DC

## 2017-09-11 NOTE — Progress Notes (Signed)
Cardiology Office Note:    Date:  09/11/2017   ID:  CARRIE READUS, DOB [birth year], MRN [id number]  PCP:  Patient, No Pcp Per  Cardiologist:  Gypsy Balsam, MD    Referring MD: No ref. provider found   No chief complaint on file. I have a chest pain  History of Present Illness:    Geoffrey Kelly is a 67 y.o. male with coronary artery disease status post microinfarction years ago.  We follow him up in our old office then he disappeared from follow-up.  He ran out of all medication he does not take any medications now.  He comes today to my office because for the last few weeks he has been experiencing chest pain he graded the pain as 2-3 in scale up to 10 happen at all different situations can last for a few minutes there is no shortness of breath associated with this sensation there is no radiation there is no relieving or aggravating factors.  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  He thinks he gets tired more easily he thinks he gets more shortness of breath than before.  Past Medical History:  Diagnosis Date  . Arthritis   . Coronary artery disease   . Fluid in knee   . Headache   . Myocardial infarction [facility])    2013    Past Surgical History:  Procedure Laterality Date  . CARDIAC CATHETERIZATION     2013  . CARDIAC SURGERY     stent placement 2013  . LUMBAR LAMINECTOMY/DECOMPRESSION MICRODISCECTOMY Left 11/03/2014   Procedure: Left Lumbar Four, Lumbar Five Diskectomy;  Surgeon: Tressie Stalker, MD;  Location: [facility];  Service: Neurosurgery;  Laterality: Left;  Left L45 diskectomy    Current Medications: No outpatient medications have been marked as taking for the 09/11/17 encounter (Office Visit) with Georgeanna Lea, MD.     Allergies:   Patient has no known allergies.   Social History   Socioeconomic History  . Marital status: Single    Spouse name: Not on file  . Number of children: Not on file  . Years of  education: Not on file  . Highest education level: Not on file  Occupational History  . Not on file  Social Needs  . Financial resource strain: Not on file  . Food insecurity:    Worry: Not on file    Inability: Not on file  . Transportation needs:    Medical: Not on file    Non-medical: Not on file  Tobacco Use  . Smoking status: Never Smoker  . Smokeless tobacco: Never Used  Substance and Sexual Activity  . Alcohol use: No  . Drug use: No  . Sexual activity: Not on file  Lifestyle  . Physical activity:    Days per week: Not on file    Minutes per session: Not on file  . Stress: Not on file  Relationships  . Social connections:    Talks on phone: Not on file    Gets together: Not on file    Attends religious service: Not on file    Active member of club or organization: Not on file    Attends meetings of clubs or organizations: Not on file    Relationship status: Not on file  Other Topics Concern  . Not on file  Social History Narrative  . Not on file     Family History:  The patient's family history includes Diabetes in his brother. ROS:   Please see the history of present illness.    All 14 point review of systems negative except as described per history of present illness  EKGs/Labs/Other Studies Reviewed:    Sinus bradycardia rate of 54, normal P interval normal QS complex duration morphology no ST segment changes  Recent Labs: No results found for requested labs within last 8760 hours.  Recent Lipid Panel No results found for: CHOL, TRIG, HDL, CHOLHDL, VLDL, LDLCALC, LDLDIRECT  Physical Exam:    VS:  BP 128/70 (BP Location: Right Arm, Patient Position: Sitting, Cuff Size: Normal)   Pulse (!) 54   Ht 5\' 9"  (1.753 m)   Wt 177 lb 6.4 oz (80.5 kg)   SpO2 97%   BMI 26.20 kg/m     Wt Readings from Last 3 Encounters:  09/11/17 177 lb 6.4 oz (80.5 kg)  11/03/14 161 lb (73 kg)  11/02/14 161 lb (73 kg)     GEN:  Well nourished, well developed in no  acute distress HEENT: Normal NECK: No JVD; No carotid bruits LYMPHATICS: No lymphadenopathy CARDIAC: RRR, no murmurs, no rubs, no gallops RESPIRATORY:  Clear to auscultation without rales, wheezing or rhonchi  ABDOMEN: Soft, non-tender, non-distended MUSCULOSKELETAL:  No edema; No deformity  SKIN: Warm and dry LOWER EXTREMITIES: no swelling NEUROLOGIC:  Alert and oriented x 3 PSYCHIATRIC:  Normal affect   ASSESSMENT:    1. Dyslipidemia    PLAN:    In order of problems listed above:  1. Coronary artery disease he does have some symptoms worrisome about reactivation of the problem especially in view of the fact that he did not take any medications for last few months.  I will start him on aspirin I will also put him on 20 mg of Lipitor, will put him on ranolazine 5 mg twice daily he will be scheduled to have a stress test.  We will do stress echocardiogram I will also put him on nitroglycerin. 2. Dyslipidemia: We will start his cholesterol medication done in the future we will check his fasting lipid profile 3. Noncompliance: I spoke to him in length and try to explain to him how critically essential is to be on medication take this medication on the regular basis.  I would not put him on beta-blocker since he appears to be bradycardic on EKG today his EKG showed me heart rate of 54 possibly left atrial enlargement normal QS complex duration morphology no ST segment changes   Medication Adjustments/Labs and Tests Ordered: Current medicines are reviewed at length with the patient today.  Concerns regarding medicines are outlined above.  No orders of the defined types were placed in this encounter.  Medication changes: No orders of the defined types were placed in this encounter.   Signed, Georgeanna Lea, MD, [facility] 09/11/2017 2:44 PM    [facility]

## 2017-09-11 NOTE — Patient Instructions (Addendum)
Medication Instructions:  Your physician has recommended you make the following change in your medication:  START 81 mg of enteric coated aspirin daily START lipitor 20 mg daily START ranexa 500 mg twice daily START Nitroglycerin 0.4 mg sublingual (under your tongue) as needed for chest pain. If experiencing chest pain, stop what you are doing and sit down. Take 1 nitroglycerin and wait 5 minutes. If chest pain continues, take another nitroglycerin and wait 5 minutes. If chest pain does not subside, take 1 more nitroglycerin and dial 911. You make take a total of 3 nitroglycerin in a 15 minute time frame.  Labwork: Your physician recommends that you have the following labs drawn: None  Testing/Procedures: Your physician has requested that you have a stress echocardiogram. For further information please visit [email or web address] Please follow instruction sheet as given.  Follow-Up: Your physician recommends that you schedule a follow-up appointment in: 4 weeks  Any Other Special Instructions Will Be Listed Below (If Applicable).     If you need a refill on your cardiac medications before your next appointment, please call your pharmacy.   [facility]  Garey Ham, RN, BSN   Cardiopulmonary Exercise Stress Test Cardiopulmonary exercise testing (CPET) is a test that checks how your heart and lungs react to exercise. This is called your exercise capacity. During this test, you will walk or run on a treadmill or pedal on a stationary bike while tests are done on your heart and lungs. You may have this test to:  See why you are short of breath.  Check for exercise intolerance.  See how your lungs work.  See how your heart works.  Check for how you are responding to a heart or lung rehabilitation program.  See if you have a heart or lung problem.  See if you are healthy enough to have surgery.  What happens before the procedure?  Follow instructions from your doctor about  what you cannot eat or drink.  Ask your doctor about changing or stopping your normal medicines. This is important if you take diabetes medicines or blood thinners.  Wear loose, comfortable clothing and shoes.  If you use an inhaler, bring it with you to the test. What happens during the procedure?  A blood pressure cuff will be placed on your arm.  Several stick-on patches (electrodes) will be placed on your chest. They will be attached to an electrocardiogram (EKG) machine.  A clip-on monitor that measures the amount of oxygen in your blood will be placed on your finger (pulse oximeter).  A clip will be placed on your nose and a mouthpiece will be placed in your mouth. This may be held in place with a headpiece. You will breathe through the mouthpiece during the test.  You will be asked to start exercising. You will be closely watched while you exercise.  The amount of effort for your exercise will be gradually increased.  During exercise, the test will measure: ? Your heart rate. ? Your heart rhythm. ? Your oxygen blood level. ? The amount of oxygen and carbon dioxide that you breathe out.  The test will end when: ? You have finished the test. ? You have reached your maximum ability to exercise. ? You have chest or leg pain, dizziness, or shortness of breath. The procedure may vary among doctors and hospitals. What happens after the procedure?  Your blood pressure and EKG will be checked to watch how you recover from the test. This information is not  intended to replace advice given to you by your health care provider. Make sure you discuss any questions you have with your health care provider. Document Released: 02/06/2009 Document Revised: 07/11/2015 Document Reviewed: 01/02/2015 Elsevier Interactive Patient Education  2018 [employer].

## 2017-09-26 ENCOUNTER — Ambulatory Visit (INDEPENDENT_AMBULATORY_CARE_PROVIDER_SITE_OTHER): Payer: Medicare Other

## 2017-09-26 DIAGNOSIS — I2510 Atherosclerotic heart disease of native coronary artery without angina pectoris: Secondary | ICD-10-CM | POA: Diagnosis not present

## 2017-09-26 DIAGNOSIS — E785 Hyperlipidemia, unspecified: Secondary | ICD-10-CM

## 2017-09-26 NOTE — Progress Notes (Signed)
Stress echocardiogram with limited exam has been performed.  Jimmy Decoda Van RDCS 

## 2017-10-09 ENCOUNTER — Ambulatory Visit: Payer: Medicare Other | Admitting: Cardiology

## 2018-06-22 ENCOUNTER — Telehealth: Payer: Self-pay | Admitting: Cardiology

## 2018-06-22 MED ORDER — ATORVASTATIN CALCIUM 20 MG PO TABS: 20.0000 mg | ORAL_TABLET | Freq: Every day | ORAL | 1 refills | Status: DC

## 2018-06-22 NOTE — Telephone Encounter (Signed)
°*  STAT* If patient is at the pharmacy, call can be transferred to refill team.   1. Which medications need to be refilled? (please list name of each medication and dose if known) Atorvastatin 20mg  once daily  2. Which pharmacy/location (including street and city if local pharmacy) is medication to be sent to? [facility] [address] [place]  3. Do they need a 30 day or 90 day supply? 90

## 2018-12-23 ENCOUNTER — Other Ambulatory Visit: Payer: Self-pay | Admitting: Cardiology

## 2018-12-28 ENCOUNTER — Other Ambulatory Visit: Payer: Self-pay | Admitting: Cardiology

## 2019-02-22 ENCOUNTER — Ambulatory Visit: Payer: Medicare Other | Admitting: Cardiology

## 2019-03-10 ENCOUNTER — Ambulatory Visit (INDEPENDENT_AMBULATORY_CARE_PROVIDER_SITE_OTHER): Payer: Medicare Other | Admitting: Cardiology

## 2019-03-10 ENCOUNTER — Encounter: Payer: Self-pay | Admitting: Cardiology

## 2019-03-10 ENCOUNTER — Other Ambulatory Visit: Payer: Self-pay

## 2019-03-10 VITALS — BP 104/64 | HR 65 | Wt 183.0 lb

## 2019-03-10 DIAGNOSIS — M5126 Other intervertebral disc displacement, lumbar region: Secondary | ICD-10-CM

## 2019-03-10 DIAGNOSIS — I25119 Atherosclerotic heart disease of native coronary artery with unspecified angina pectoris: Secondary | ICD-10-CM | POA: Diagnosis not present

## 2019-03-10 DIAGNOSIS — E785 Hyperlipidemia, unspecified: Secondary | ICD-10-CM

## 2019-03-10 NOTE — Progress Notes (Signed)
Cardiology Office Note:    Date:  03/10/2019   ID:  Geoffrey Kelly, DOB 1950/06/01, MRN [id number]  PCP:  Patient, No Pcp Per  Cardiologist:  Jenne Campus, MD    Referring MD: No ref. provider found   Chief Complaint  Patient presents with  . Follow-up    History of Present Illness:    Geoffrey Kelly is a 69 y.o. male past medical history significant for coronary artery disease status post myocardial infarction many years ago.  He comes today to my office for follow-up.  Also history of dyslipidemia.  Last time when I seen him he was complaining having some chest pain which was more than a year ago he did have a stress test did quite well on the treadmill have no evidence of ischemia since that time he is doing well.  Denies having a chest pain tightness squeezing pressure burning chest but overall complain of being more tired and exhausted.  Past Medical History:  Diagnosis Date  . Arthritis   . Coronary artery disease   . Fluid in knee   . Headache   . Myocardial infarction [facility])    2013    Past Surgical History:  Procedure Laterality Date  . CARDIAC CATHETERIZATION     2013  . CARDIAC SURGERY     stent placement 2013  . LUMBAR LAMINECTOMY/DECOMPRESSION MICRODISCECTOMY Left 11/03/2014   Procedure: Left Lumbar Four, Lumbar Five Diskectomy;  Surgeon: Newman Pies, MD;  Location: [place] [facility];  Service: Neurosurgery;  Laterality: Left;  Left L45 diskectomy    Current Medications: Current Meds  Medication Sig  . aspirin 81 MG tablet Take 1 tablet (81 mg total) by mouth daily.  Marland Kitchen atorvastatin (LIPITOR) 20 MG tablet TAKE 1 TABLET (20 MG TOTAL) BY MOUTH DAILY AT 6 PM.  . ranolazine (RANEXA) 500 MG 12 hr tablet Take 1 tablet (500 mg total) by mouth 2 (two) times daily.     Allergies:   Patient has no known allergies.   Social History   Socioeconomic History  . Marital status: Single    Spouse name: Not on file  . Number of children: Not on file  . Years of education:  Not on file  . Highest education level: Not on file  Occupational History  . Not on file  Tobacco Use  . Smoking status: Never Smoker  . Smokeless tobacco: Never Used  Substance and Sexual Activity  . Alcohol use: No  . Drug use: No  . Sexual activity: Not on file  Other Topics Concern  . Not on file  Social History Narrative  . Not on file   Social Determinants of Health   Financial Resource Strain:   . Difficulty of Paying Living Expenses: Not on file  Food Insecurity:   . Worried About Charity fundraiser in the Last Year: Not on file  . Ran Out of Food in the Last Year: Not on file  Transportation Needs:   . Lack of Transportation (Medical): Not on file  . Lack of Transportation (Non-Medical): Not on file  Physical Activity:   . Days of Exercise per Week: Not on file  . Minutes of Exercise per Session: Not on file  Stress:   . Feeling of Stress : Not on file  Social Connections:   . Frequency of Communication with Friends and Family: Not on file  . Frequency of Social Gatherings with Friends and Family: Not on file  . Attends Religious Services: Not  on file  . Active Member of Clubs or Organizations: Not on file  . Attends Banker Meetings: Not on file  . Marital Status: Not on file     Family History: The patient's family history includes Diabetes in his brother. ROS:   Please see the history of present illness.    All 14 point review of systems negative except as described per history of present illness  EKGs/Labs/Other Studies Reviewed:      Recent Labs: No results found for requested labs within last 8760 hours.  Recent Lipid Panel No results found for: CHOL, TRIG, HDL, CHOLHDL, VLDL, LDLCALC, LDLDIRECT  Physical Exam:    VS:  BP 104/64 (BP Location: Left Arm, Patient Position: Sitting, Cuff Size: Normal)   Pulse 65   Wt 183 lb (83 kg)   SpO2 96%   BMI 27.02 kg/m     Wt Readings from Last 3 Encounters:  03/10/19 183 lb (83 kg)    09/11/17 177 lb 6.4 oz (80.5 kg)  11/03/14 161 lb (73 kg)     GEN:  Well nourished, well developed in no acute distress HEENT: Normal NECK: No JVD; No carotid bruits LYMPHATICS: No lymphadenopathy CARDIAC: RRR, no murmurs, no rubs, no gallops RESPIRATORY:  Clear to auscultation without rales, wheezing or rhonchi  ABDOMEN: Soft, non-tender, non-distended MUSCULOSKELETAL:  No edema; No deformity  SKIN: Warm and dry LOWER EXTREMITIES: no swelling NEUROLOGIC:  Alert and oriented x 3 PSYCHIATRIC:  Normal affect   ASSESSMENT:    1. Coronary artery disease involving native coronary artery of native heart with angina pectoris (HCC)   2. Dyslipidemia   3. Lumbar herniated disc    PLAN:    In order of problems listed above:  1. Coronary artery disease doing well from that point review on appropriate medications which I will continue.  He is not on beta-blocker because of bradycardia. 2. Dyslipidemia we will check his fasting lipid profile. 3. History of lumbar herniated disc.  Stable 4. I will ask him to have echocardiogram to assess left ventricle ejection fraction.  This is mostly because of exertional shortness of breath. 5. Stress test from last year reviewed.  Good quality, negative.   Medication Adjustments/Labs and Tests Ordered: Current medicines are reviewed at length with the patient today.  Concerns regarding medicines are outlined above.  No orders of the defined types were placed in this encounter.  Medication changes: No orders of the defined types were placed in this encounter.   Signed, Georgeanna Lea, MD, [facility] 03/10/2019 2:15 PM    [facility]

## 2019-03-10 NOTE — Patient Instructions (Signed)
Medication Instructions:  Your physician recommends that you continue on your current medications as directed. Please refer to the Current Medication list given to you today.  *If you need a refill on your cardiac medications before your next appointment, please call your pharmacy*  Lab Work: Your physician recommends that you return for lab work when you come for echocardiogram: fasting: lipids   If you have labs (blood work) drawn today and your tests are completely normal, you will receive your results only by: Marland Kitchen MyChart Message (if you have MyChart) OR . A paper copy in the mail If you have any lab test that is abnormal or we need to change your treatment, we will call you to review the results.  Testing/Procedures: Your physician has requested that you have an echocardiogram. Echocardiography is a painless test that uses sound waves to create images of your heart. It provides your doctor with information about the size and shape of your heart and how well your heart's chambers and valves are working. This procedure takes approximately one hour. There are no restrictions for this procedure.    Follow-Up: At [facility], you and your health needs are our priority.  As part of our continuing mission to provide you with exceptional heart care, we have created designated Provider Care Teams.  These Care Teams include your primary Cardiologist (physician) and Advanced Practice Providers (APPs -  Physician Assistants and Nurse Practitioners) who all work together to provide you with the care you need, when you need it.  Your next appointment:   6 month(s)  The format for your next appointment:   In Person  Provider:   Gypsy Balsam, MD  Other Instructions   Echocardiogram An echocardiogram is a procedure that uses painless sound waves (ultrasound) to produce an image of the heart. Images from an echocardiogram can provide important information about:  Signs of coronary artery  disease (CAD).  Aneurysm detection. An aneurysm is a weak or damaged part of an artery wall that bulges out from the normal force of blood pumping through the body.  Heart size and shape. Changes in the size or shape of the heart can be associated with certain conditions, including heart failure, aneurysm, and CAD.  Heart muscle function.  Heart valve function.  Signs of a past heart attack.  Fluid buildup around the heart.  Thickening of the heart muscle.  A tumor or infectious growth around the heart valves. Tell a health care provider about:  Any allergies you have.  All medicines you are taking, including vitamins, herbs, eye drops, creams, and over-the-counter medicines.  Any blood disorders you have.  Any surgeries you have had.  Any medical conditions you have.  Whether you are pregnant or may be pregnant. What are the risks? Generally, this is a safe procedure. However, problems may occur, including:  Allergic reaction to dye (contrast) that may be used during the procedure. What happens before the procedure? No specific preparation is needed. You may eat and drink normally. What happens during the procedure?   An IV tube may be inserted into one of your veins.  You may receive contrast through this tube. A contrast is an injection that improves the quality of the pictures from your heart.  A gel will be applied to your chest.  A wand-like tool (transducer) will be moved over your chest. The gel will help to transmit the sound waves from the transducer.  The sound waves will harmlessly bounce off of your heart  to allow the heart images to be captured in real-time motion. The images will be recorded on a computer. The procedure may vary among health care providers and hospitals. What happens after the procedure?  You may return to your normal, everyday life, including diet, activities, and medicines, unless your health care provider tells you not to do  that. Summary  An echocardiogram is a procedure that uses painless sound waves (ultrasound) to produce an image of the heart.  Images from an echocardiogram can provide important information about the size and shape of your heart, heart muscle function, heart valve function, and fluid buildup around your heart.  You do not need to do anything to prepare before this procedure. You may eat and drink normally.  After the echocardiogram is completed, you may return to your normal, everyday life, unless your health care provider tells you not to do that. This information is not intended to replace advice given to you by your health care provider. Make sure you discuss any questions you have with your health care provider. Document Revised: 06/11/2018 Document Reviewed: 03/23/2016 Elsevier Patient Education  [place].

## 2019-03-23 ENCOUNTER — Other Ambulatory Visit: Payer: Self-pay | Admitting: Cardiology

## 2019-04-30 ENCOUNTER — Ambulatory Visit (INDEPENDENT_AMBULATORY_CARE_PROVIDER_SITE_OTHER): Payer: Medicare Other

## 2019-04-30 ENCOUNTER — Other Ambulatory Visit: Payer: Self-pay

## 2019-04-30 DIAGNOSIS — I25119 Atherosclerotic heart disease of native coronary artery with unspecified angina pectoris: Secondary | ICD-10-CM | POA: Diagnosis not present

## 2019-04-30 NOTE — Progress Notes (Signed)
Complete echocardiogram has been performed.  Jimmy Sharyn Brilliant RDCS, RVT 

## 2019-05-01 LAB — LIPID PANEL
Chol/HDL Ratio: 3 ratio (ref 0.0–5.0)
Cholesterol, Total: 157 mg/dL (ref 100–199)
HDL: 53 mg/dL (ref >39)
LDL Chol Calc (NIH): 90 mg/dL (ref 0–99)
Triglycerides: 71 mg/dL (ref 0–149)
VLDL Cholesterol Cal: 14 mg/dL (ref 5–40)

## 2019-05-14 ENCOUNTER — Telehealth: Payer: Self-pay | Admitting: *Deleted

## 2019-05-14 DIAGNOSIS — Z79899 Other long term (current) drug therapy: Secondary | ICD-10-CM

## 2019-05-14 MED ORDER — ATORVASTATIN CALCIUM 40 MG PO TABS: 40.0000 mg | ORAL_TABLET | Freq: Every day | ORAL | 3 refills | Status: DC

## 2019-05-14 NOTE — Telephone Encounter (Signed)
-----   Message from Georgeanna Lea, MD sent at 05/05/2019 11:13 AM EST ----- LDL is still not at target.  Need to be less than 70.  Please double the dose of Lipitor to 40 mg daily, fasting lipid profile in 6 weeks

## 2020-05-17 ENCOUNTER — Other Ambulatory Visit: Payer: Self-pay | Admitting: Cardiology

## 2020-05-17 NOTE — Telephone Encounter (Signed)
Atorvastatin approved and sent 

## 2020-06-10 ENCOUNTER — Other Ambulatory Visit: Payer: Self-pay | Admitting: Cardiology

## 2020-06-12 NOTE — Telephone Encounter (Signed)
Rx refill sent to pharmacy. 

## 2020-06-14 ENCOUNTER — Telehealth: Payer: Self-pay | Admitting: Cardiology

## 2020-06-14 MED ORDER — ATORVASTATIN CALCIUM 40 MG PO TABS: 1.0000 | ORAL_TABLET | Freq: Every day | ORAL | 3 refills | Status: DC

## 2020-06-14 NOTE — Telephone Encounter (Signed)
*  STAT* If patient is at the pharmacy, call can be transferred to refill team.   1. Which medications need to be refilled? (please list name of each medication and dose if known)  atorvastatin (LIPITOR) 40 MG tablet  2. Which pharmacy/location (including street and city if local pharmacy) is medication to be sent to? [id number] - [place], [place] - [address]  3. Do they need a 30 day or 90 day supply? 30   Patient is scheduled to see Dr. Kirtland Bouchard 06/29/20 but will run out of medication prior to appt

## 2020-06-14 NOTE — Telephone Encounter (Signed)
Refill sent in per request.  

## 2020-06-26 DIAGNOSIS — M25469 Effusion, unspecified knee: Secondary | ICD-10-CM | POA: Insufficient documentation

## 2020-06-26 DIAGNOSIS — I219 Acute myocardial infarction, unspecified: Secondary | ICD-10-CM | POA: Insufficient documentation

## 2020-06-26 DIAGNOSIS — I2510 Atherosclerotic heart disease of native coronary artery without angina pectoris: Secondary | ICD-10-CM | POA: Insufficient documentation

## 2020-06-26 DIAGNOSIS — R519 Headache, unspecified: Secondary | ICD-10-CM | POA: Insufficient documentation

## 2020-06-26 DIAGNOSIS — M1990 Unspecified osteoarthritis, unspecified site: Secondary | ICD-10-CM | POA: Insufficient documentation

## 2020-06-29 ENCOUNTER — Other Ambulatory Visit: Payer: Self-pay

## 2020-06-29 ENCOUNTER — Encounter: Payer: Self-pay | Admitting: Cardiology

## 2020-06-29 ENCOUNTER — Ambulatory Visit (INDEPENDENT_AMBULATORY_CARE_PROVIDER_SITE_OTHER): Payer: Medicare Other | Admitting: Cardiology

## 2020-06-29 VITALS — BP 132/76 | HR 64 | Ht 70.0 in | Wt 174.0 lb

## 2020-06-29 DIAGNOSIS — I25119 Atherosclerotic heart disease of native coronary artery with unspecified angina pectoris: Secondary | ICD-10-CM | POA: Diagnosis not present

## 2020-06-29 DIAGNOSIS — I2510 Atherosclerotic heart disease of native coronary artery without angina pectoris: Secondary | ICD-10-CM | POA: Diagnosis not present

## 2020-06-29 DIAGNOSIS — E785 Hyperlipidemia, unspecified: Secondary | ICD-10-CM

## 2020-06-29 MED ORDER — ATORVASTATIN CALCIUM 40 MG PO TABS: 1.0000 | ORAL_TABLET | Freq: Every day | ORAL | 1 refills | Status: DC

## 2020-06-29 NOTE — Patient Instructions (Addendum)
Medication Instructions:  Your physician recommends that you continue on your current medications as directed. Please refer to the Current Medication list given to you today.  *If you need a refill on your cardiac medications before your next appointment, please call your pharmacy*   Lab Work: Your physician recommends that you return for lab work when fasting: lipid   If you have labs (blood work) drawn today and your tests are completely normal, you will receive your results only by: . MyChart Message (if you have MyChart) OR . A paper copy in the mail If you have any lab test that is abnormal or we need to change your treatment, we will call you to review the results.   Testing/Procedures: None   Follow-Up: At [facility], you and your health needs are our priority.  As part of our continuing mission to provide you with exceptional heart care, we have created designated Provider Care Teams.  These Care Teams include your primary Cardiologist (physician) and Advanced Practice Providers (APPs -  Physician Assistants and Nurse Practitioners) who all work together to provide you with the care you need, when you need it.  We recommend signing up for the patient portal called "MyChart".  Sign up information is provided on this After Visit Summary.  MyChart is used to connect with patients for Virtual Visits (Telemedicine).  Patients are able to view lab/test results, encounter notes, upcoming appointments, etc.  Non-urgent messages can be sent to your provider as well.   To learn more about what you can do with MyChart, go to [email or web address].    Your next appointment:   1 year(s)  The format for your next appointment:   In Person  Provider:   Robert Krasowski, MD   Other Instructions   

## 2020-06-29 NOTE — Progress Notes (Signed)
Cardiology Office Note:    Date:  06/29/2020   ID:  Geoffrey Kelly, DOB Jun 22, 1950, MRN [id number]  PCP:  Patient, No Pcp Per (Inactive)  Cardiologist:  Gypsy Balsam, MD    Referring MD: No ref. provider found   Chief Complaint  Patient presents with  . Medication Refill    Atorvastatin  I am fine  History of Present Illness:    Geoffrey Kelly is a 70 y.o. male with past medical history significant for coronary artery disease.  He did have myocardial infarction many years ago, likely his left ventricle ejection fraction seems to be preserved, also history of dyslipidemia.  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  He gets short of breath sometimes while walking but overall seems to be doing very well.  Denies have any chest pain tightness squeezing pressure burning chest.  Past Medical History:  Diagnosis Date  . Arthritis   . Coronary artery disease   . 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 Dyslipidemia 10/12/2014  . Fluid in knee   . Headache   . Lumbar herniated disc 11/03/2014  . Myocardial infarction [facility])    2013    Past Surgical History:  Procedure Laterality Date  . CARDIAC CATHETERIZATION     2013  . CARDIAC SURGERY     stent placement 2013  . LUMBAR LAMINECTOMY/DECOMPRESSION MICRODISCECTOMY Left 11/03/2014   Procedure: Left Lumbar Four, Lumbar Five Diskectomy;  Surgeon: Tressie Stalker, MD;  Location: [facility];  Service: Neurosurgery;  Laterality: Left;  Left L45 diskectomy    Current Medications: Current Meds  Medication Sig  . aspirin 81 MG tablet Take 1 tablet (81 mg total) by mouth daily.  Marland Kitchen atorvastatin (LIPITOR) 40 MG tablet Take 1 tablet (40 mg total) by mouth daily.     Allergies:   Patient has no known allergies.   Social History   Socioeconomic History  . Marital status: Single    Spouse name: Not on file   . Number of children: Not on file  . Years of education: Not on file  . Highest education level: Not on file  Occupational History  . Not on file  Tobacco Use  . Smoking status: Never Smoker  . Smokeless tobacco: Never Used  Substance and Sexual Activity  . Alcohol use: No  . Drug use: No  . Sexual activity: Not on file  Other Topics Concern  . Not on file  Social History Narrative  . Not on file   Social Determinants of Health   Financial Resource Strain: Not on file  Food Insecurity: Not on file  Transportation Needs: Not on file  Physical Activity: Not on file  Stress: Not on file  Social Connections: Not on file     Family History: The patient's family history includes Diabetes in his brother. ROS:   Please see the history of present illness.    All 14 point review of systems negative except as described per history of present illness  EKGs/Labs/Other Studies Reviewed:      Recent Labs: No results found for requested labs within last 8760 hours.  Recent Lipid Panel    Component Value Date/Time   CHOL 157 04/30/2019 1344   TRIG 71 04/30/2019 1344   HDL 53 04/30/2019 1344   CHOLHDL 3.0 04/30/2019 1344   LDLCALC 90 04/30/2019  1344    Physical Exam:    VS:  BP 132/76 (BP Location: Right Arm, Patient Position: Sitting)   Pulse 64   Ht 5\' 10"  (1.778 m)   Wt 174 lb (78.9 kg)   SpO2 94%   BMI 24.97 kg/m     Wt Readings from Last 3 Encounters:  06/29/20 174 lb (78.9 kg)  03/10/19 183 lb (83 kg)  09/11/17 177 lb 6.4 oz (80.5 kg)     GEN:  Well nourished, well developed in no acute distress HEENT: Normal NECK: No JVD; No carotid bruits LYMPHATICS: No lymphadenopathy CARDIAC: RRR, no murmurs, no rubs, no gallops RESPIRATORY:  Clear to auscultation without rales, wheezing or rhonchi  ABDOMEN: Soft, non-tender, non-distended MUSCULOSKELETAL:  No edema; No deformity  SKIN: Warm and dry LOWER EXTREMITIES: no swelling NEUROLOGIC:  Alert and oriented x  3 PSYCHIATRIC:  Normal affect   ASSESSMENT:    1. Coronary artery disease involving native coronary artery of native heart with angina pectoris (HCC)   2. Dyslipidemia   3. Coronary artery disease involving native coronary artery of native heart without angina pectoris    PLAN:    In order of problems listed above:  1. Coronary disease status post myocardial infarction years ago ejection fraction preserved on antiplatelet therapy as well as statin which I will continue. 2. Dyslipidemia we will continue statin, will check his fasting lipid profile today, I did review his K PN from before and last data I have is from February 2021 with HDL of 53 LDL 90 we will continue present management. 3. Fatigue and tiredness multifactorial echocardiogram reviewed from last time.  We did have a long discussion about lifestyle need to rest sometimes as well as good diet.   Medication Adjustments/Labs and Tests Ordered: Current medicines are reviewed at length with the patient today.  Concerns regarding medicines are outlined above.  No orders of the defined types were placed in this encounter.  Medication changes: No orders of the defined types were placed in this encounter.   Signed, March 2021, MD, [facility] 06/29/2020 1:48 PM    [facility]

## 2020-06-29 NOTE — Addendum Note (Signed)
Addended by: Hazle Quant on: 06/29/2020 02:06 PM   Modules accepted: Orders

## 2021-07-09 ENCOUNTER — Other Ambulatory Visit: Payer: Self-pay | Admitting: Cardiology

## 2021-10-04 ENCOUNTER — Ambulatory Visit: Payer: Medicare Other | Admitting: Cardiology

## 2021-12-28 ENCOUNTER — Ambulatory Visit: Payer: Medicare Other | Admitting: Cardiology

## 2022-01-30 ENCOUNTER — Other Ambulatory Visit: Payer: Self-pay | Admitting: Cardiology

## 2022-01-31 LAB — LIPID PANEL
Chol/HDL Ratio: 3.4 ratio (ref 0.0–5.0)
Cholesterol, Total: 158 mg/dL (ref 100–199)
HDL: 46 mg/dL (ref >39)
LDL Chol Calc (NIH): 96 mg/dL (ref 0–99)
Triglycerides: 82 mg/dL (ref 0–149)
VLDL Cholesterol Cal: 16 mg/dL (ref 5–40)

## 2022-02-06 ENCOUNTER — Telehealth: Payer: Self-pay

## 2022-02-06 DIAGNOSIS — E785 Hyperlipidemia, unspecified: Secondary | ICD-10-CM

## 2022-02-06 DIAGNOSIS — I25119 Atherosclerotic heart disease of native coronary artery with unspecified angina pectoris: Secondary | ICD-10-CM

## 2022-02-06 MED ORDER — ATORVASTATIN CALCIUM 80 MG PO TABS: 80.0000 mg | ORAL_TABLET | Freq: Every day | ORAL | 3 refills | Status: DC

## 2022-02-06 NOTE — Telephone Encounter (Signed)
-----   Message from Georgeanna Lea, MD sent at 02/04/2022 10:08 AM EST ----- Cholesterol not well-controlled increase Lipitor to 80 mg once daily, fasting lipid profile is TLT 6 weeks

## 2022-02-15 ENCOUNTER — Encounter: Payer: Self-pay | Admitting: Cardiology

## 2022-02-15 ENCOUNTER — Ambulatory Visit: Payer: Medicare Other | Attending: Cardiology | Admitting: Cardiology

## 2022-02-15 VITALS — BP 110/68 | HR 62 | Ht 70.0 in | Wt 179.0 lb

## 2022-02-15 DIAGNOSIS — I2510 Atherosclerotic heart disease of native coronary artery without angina pectoris: Secondary | ICD-10-CM | POA: Diagnosis not present

## 2022-02-15 DIAGNOSIS — R0609 Other forms of dyspnea: Secondary | ICD-10-CM | POA: Diagnosis present

## 2022-02-15 DIAGNOSIS — E785 Hyperlipidemia, unspecified: Secondary | ICD-10-CM

## 2022-02-15 DIAGNOSIS — I25119 Atherosclerotic heart disease of native coronary artery with unspecified angina pectoris: Secondary | ICD-10-CM | POA: Diagnosis not present

## 2022-02-15 NOTE — Patient Instructions (Addendum)
Medication Instructions:  Your physician has recommended you make the following change in your medication:   Lipitor to 80mg  daily-    Lab Work: Your physician recommends that you return for lab work in: 6 weeks You need to have labs done when you are fasting.  You can come Monday through Friday 8:30 am to 12:00 pm and 1:15 to 4:30. You do not need to make an appointment as the order has already been placed. The labs you are going to have done are ALT, AST,  Lipids.    Testing/Procedures: Your physician has requested that you have an echocardiogram. Echocardiography is a painless test that uses sound waves to create images of your heart. It provides your doctor with information about the size and shape of your heart and how well your heart's chambers and valves are working. This procedure takes approximately one hour. There are no restrictions for this procedure. Please do NOT wear cologne, perfume, aftershave, or lotions (deodorant is allowed). Please arrive 15 minutes prior to your appointment time.    Follow-Up: At [facility], you and your health needs are our priority.  As part of our continuing mission to provide you with exceptional heart care, we have created designated Provider Care Teams.  These Care Teams include your primary Cardiologist (physician) and Advanced Practice Providers (APPs -  Physician Assistants and Nurse Practitioners) who all work together to provide you with the care you need, when you need it.  We recommend signing up for the patient portal called "MyChart".  Sign up information is provided on this After Visit Summary.  MyChart is used to connect with patients for Virtual Visits (Telemedicine).  Patients are able to view lab/test results, encounter notes, upcoming appointments, etc.  Non-urgent messages can be sent to your provider as well.   To learn more about what you can do with MyChart, go to [facility].    Your next appointment:   12  month(s)  The format for your next appointment:   In Person  Provider:   [email or web address], MD    Other Instructions NA

## 2022-02-15 NOTE — Progress Notes (Signed)
Cardiology Office Note:    Date:  02/15/2022   ID:  Geoffrey Kelly, DOB [birth year], MRN [id number]  PCP:  Patient, No Pcp Per  Cardiologist:  Gypsy Balsam, MD    Referring MD: No ref. provider found   Chief Complaint  Patient presents with   Annual Exam    History of Present Illness:    Geoffrey Kelly is a 71 y.o. male past medical history significant for coronary artery disease status post myocardial infarction many years ago likely his left ventricle ejection fraction is preserved, dyslipidemia. He is in my office today for follow-up.  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  But he said lately became tired and exhausted also complained of having some shortness of breath.  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) 10/12/2014   Dyslipidemia 10/12/2014   Fluid in knee    Headache    Lumbar herniated disc 11/03/2014   Myocardial infarction [facility])    2013    Past Surgical History:  Procedure Laterality Date   CARDIAC CATHETERIZATION     2013   CARDIAC SURGERY     stent placement 2013   LUMBAR LAMINECTOMY/DECOMPRESSION MICRODISCECTOMY Left 11/03/2014   Procedure: Left Lumbar Four, Lumbar Five Diskectomy;  Surgeon: Tressie Stalker, MD;  Location: [facility];  Service: Neurosurgery;  Laterality: Left;  Left L45 diskectomy    Current Medications: Current Meds  Medication Sig   aspirin 81 MG tablet Take 1 tablet (81 mg total) by mouth daily.   atorvastatin (LIPITOR) 80 MG tablet Take 1 tablet (80 mg total) by mouth daily.     Allergies:   Patient has no known allergies.   Social History   Socioeconomic History   Marital status: Single    Spouse name: Not on file   Number of children: Not on file   Years of education: Not on file   Highest education  level: Not on file  Occupational History   Not on file  Tobacco Use   Smoking status: Never   Smokeless tobacco: Never  Substance and Sexual Activity   Alcohol use: No   Drug use: No   Sexual activity: Not on file  Other Topics Concern   Not on file  Social History Narrative   Not on file   Social Determinants of Health   Financial Resource Strain: Not on file  Food Insecurity: Not on file  Transportation Needs: Not on file  Physical Activity: Not on file  Stress: Not on file  Social Connections: Not on file     Family History: The patient's family history includes Diabetes in his brother. ROS:   Please see the history of present illness.    All 14 point review of systems negative except as described per history of present illness  EKGs/Labs/Other Studies Reviewed:      Recent Labs: No results found for requested labs within last 365 days.  Recent Lipid Panel    Component Value Date/Time   CHOL 158 01/30/2022 0928   TRIG 82 01/30/2022 0928   HDL 46 01/30/2022 0928   CHOLHDL 3.4 01/30/2022 0928   LDLCALC 96 01/30/2022 0928    Physical Exam:    VS:  BP 110/68 (BP Location: Left Arm,  Patient Position: Sitting, Cuff Size: Normal)   Pulse 62   Ht 5\' 10"  (1.778 m)   Wt 179 lb (81.2 kg)   SpO2 99%   BMI 25.68 kg/m     Wt Readings from Last 3 Encounters:  02/15/22 179 lb (81.2 kg)  06/29/20 174 lb (78.9 kg)  03/10/19 183 lb (83 kg)     GEN:  Well nourished, well developed in no acute distress HEENT: Normal NECK: No JVD; No carotid bruits LYMPHATICS: No lymphadenopathy CARDIAC: RRR, no murmurs, no rubs, no gallops RESPIRATORY:  Clear to auscultation without rales, wheezing or rhonchi  ABDOMEN: Soft, non-tender, non-distended MUSCULOSKELETAL:  No edema; No deformity  SKIN: Warm and dry LOWER EXTREMITIES: no swelling NEUROLOGIC:  Alert and oriented x 3 PSYCHIATRIC:  Normal affect   ASSESSMENT:    1. Coronary artery disease involving native coronary  artery of native heart with angina pectoris (HCC)   2. Dyslipidemia   3. Dyspnea on exertion   4. Coronary artery disease involving native coronary artery of native heart without angina pectoris    PLAN:    In order of problems listed above:  Coronary disease stable from that point review.  The more about his symptoms of dyspnea on exertion I will ask him to have echocardiogram to recheck left ventricle ejection fraction in the meantime we will continue antiplatelet therapy as well as statin Dyslipidemia I did review K PN which show me his LDL of 96 HDL 46.  Not sufficiently controlled, I will increase dose of Lipitor to 80 mg daily fasting lipid profile, AST LT will be done within the next 6 weeks. Dyspnea on exertion: Echocardiogram will be done   Medication Adjustments/Labs and Tests Ordered: Current medicines are reviewed at length with the patient today.  Concerns regarding medicines are outlined above.  Orders Placed This Encounter  Procedures   ALT   AST   Lipid panel   EKG 12-Lead   ECHOCARDIOGRAM COMPLETE   Medication changes: No orders of the defined types were placed in this encounter.   Signed, 05/08/19, MD, [facility] 02/15/2022 11:32 AM    [facility]

## 2022-02-27 ENCOUNTER — Ambulatory Visit: Payer: Medicare Other | Attending: Cardiology

## 2022-02-27 DIAGNOSIS — R0609 Other forms of dyspnea: Secondary | ICD-10-CM | POA: Diagnosis present

## 2022-02-27 LAB — ECHOCARDIOGRAM COMPLETE
Area-P 1/2: 2.77 cm2
S' Lateral: 2.7 cm

## 2022-03-01 ENCOUNTER — Telehealth: Payer: Self-pay | Admitting: Cardiology

## 2022-03-01 NOTE — Telephone Encounter (Signed)
Patient informed of results.  

## 2022-03-01 NOTE — Telephone Encounter (Signed)
Patient is requesting a call back to discuss echo results. 

## 2023-02-01 ENCOUNTER — Other Ambulatory Visit: Payer: Self-pay | Admitting: Cardiology

## 2023-02-03 ENCOUNTER — Telehealth: Payer: Self-pay | Admitting: Cardiology

## 2023-02-03 NOTE — Telephone Encounter (Signed)
Patient is needing a refill on his lipitor. CB # [phone]

## 2023-03-04 ENCOUNTER — Other Ambulatory Visit: Payer: Self-pay | Admitting: Cardiology

## 2023-03-31 ENCOUNTER — Other Ambulatory Visit: Payer: Self-pay

## 2023-03-31 DIAGNOSIS — E785 Hyperlipidemia, unspecified: Secondary | ICD-10-CM

## 2023-03-31 NOTE — Progress Notes (Signed)
Cardiology Office Note:  .   Date:  04/04/2023  ID:  Judi Saa, DOB [birth year], MRN [id number] PCP: Patient, No Pcp Per  [facility] Providers Cardiologist:  Gypsy Balsam, MD    History of Present Illness: .   BOEN STERBENZ is a 73 y.o. male with a past medical history of CAD s/p DES in 2013 unknown vessel location, dyslipidemia.  02/27/2022 echo EF 60 to 65%, grade 1 DD, no valvular abnormalities 04/30/2019 echo EF 60 to 65%, grade 1 DD, no valvular abnormalities 09/26/2017 stress echo negative for inducible ischemia 2013 [facility] records not available for review reported stent but unknown location  Most recently evaluated by Dr. Bing Matter on 02/15/2022, he endorsed worsening fatigue and exhaustion caring for his property.  A repeat echocardiogram was arranged which was overall unchanged and he was advised to follow-up in 1 year.  He presents today with concerns of chest pain and profound fatigue.  He states the fatigue has been going on for several years.  Regarding the chest pain, he is a poor historian, symptoms do not sound to be consistent with angina but again it is hard for him to explain how he is feeling. He denies palpitations, dyspnea, pnd, orthopnea, n, v, dizziness, syncope, edema, weight gain, or early satiety.   ROS: Review of Systems  Constitutional:  Positive for malaise/fatigue.  Cardiovascular:  Positive for chest pain.  All other systems reviewed and are negative.    Studies Reviewed: Marland Kitchen   EKG Interpretation Date/Time:  Friday April 04 2023 11:30:06 EST Ventricular Rate:  58 PR Interval:  162 QRS Duration:  84 QT Interval:  426 QTC Calculation: 418 R Axis:   81  Text Interpretation: Sinus bradycardia Possible Anterior infarct , age undetermined Abnormal ECG No previous ECGs available Confirmed by Wallis Bamberg [phone]) on 04/04/2023 11:39:13 AM    Cardiac Studies & Procedures     STRESS TESTS  ECHOCARDIOGRAM STRESS TEST  09/26/2017  Narrative *[place]* *[facility]* [address] [place], [place] [address] [phone]  ------------------------------------------------------------------- Stress Echocardiography  Patient:    Doroteo, Nickolson MR #:       [id number] Study Date: 09/26/2017 Gender:     M Age:        66 Height:     175.3 cm Weight:     80.5 kg BSA:        1.99 m^2 Pt. Status: Room:  ATTENDING    Gypsy Balsam, MD ORDERING     Gypsy Balsam, MD REFERRING    Gypsy Balsam, MD SONOGRAPHER  Lanae Crumbly, RDCS PERFORMING   [facility], [place]  cc:  -------------------------------------------------------------------  ------------------------------------------------------------------- Indications:      CAD of native vessels 414.01.  Chest pain 786.51.  ------------------------------------------------------------------- History:   Risk factors:  Dyslipidemia.  ------------------------------------------------------------------- Study Conclusions  - Stress ECG conclusions: There were no stress arrhythmias or conduction abnormalities. The stress ECG was negative for ischemia. - Staged echo: There was no echocardiographic evidence for stress-induced ischemia.  Impressions:  - 1. Left ventricular systolic function is preserved visually estimated ejection fraction 60 to 65%. Stress echo negative for evidence of inducible ischemia. Normal left ventricular systolic function at rest and good augmentation of the left ventricular segments at stress. 2. Normal exercise capacity.  ------------------------------------------------------------------- Study data:   Study status:  Routine.  Consent:  The risks, benefits, and alternatives to the procedure were explained to the patient and informed consent was obtained.  Procedure:  Initial setup. The  patient was brought to the laboratory. A baseline ECG was recorded. Surface ECG leads and automatic cuff blood  pressure measurements were monitored. Treadmill exercise testing was performed using the Bruce protocol. The patient exercised for 6 min 3 sec, to protocol stage 3. Exercise was terminated due to achievement of target heart rate. The patient was positioned for image acquisition and recovery monitoring. Transthoracic stress echocardiography for chest pain evaluation. Image quality was good. Images were captured at baseline and peak exercise.  Study completion:  The patient tolerated the procedure well. There were no complications.          Bruce protocol. Stress echocardiography. Birthdate:  Patient birthdate: August 20, 1950.  Age:  Patient is 73 yr old.  Sex:  Gender: male.    BMI: 26.2 kg/m^2.  Blood pressure: 128/70  Patient status:  Outpatient.  Study date:  Study date: 09/26/2017. Study time: 02:11 PM.  -------------------------------------------------------------------  ------------------------------------------------------------------- Baseline ECG:  Normal.  ------------------------------------------------------------------- Stress protocol:  +--------+---------------+---+------------+--------+ !Stage   !Time into phase!HR !BP (mmHg)   !Symptoms! +--------+---------------+---+------------+--------+ !Baseline!---------------!88 !135/84 (101)!None    ! +--------+---------------+---+------------+--------+ !Stage 1 !---------------!118!------------!--------! +--------+---------------+---+------------+--------+ !Stage 2 !---------------!148!183/87 (119)!--------! +--------+---------------+---+------------+--------+ !Recovery!4 min 59 sec   !86 !148/79 (102)!--------! +--------+---------------+---+------------+--------+  ------------------------------------------------------------------- Stress results:   Maximal heart rate during stress was 148 bpm (95% of maximal predicted heart rate). The maximal predicted heart rate was 155 bpm.The target heart rate was achieved. The heart  rate response to stress was normal. There was a normal resting blood pressure with an appropriate response to stress. The rate-pressure product for the peak heart rate and blood pressure was 16109 mm Hg/min.  The patient experienced no chest pain during stress.  ------------------------------------------------------------------- Stress ECG:  There were no stress arrhythmias or conduction abnormalities.  The stress ECG was negative for ischemia.  ------------------------------------------------------------------- Baseline:  - The estimated LV ejection fraction was 60%. - Normal wall motion; no LV regional wall motion abnormalities.  Peak stress:  - The estimated LV ejection fraction was 75%. - Normal wall motion; no LV regional wall motion abnormalities.  ------------------------------------------------------------------- Stress echo results:     Left ventricular ejection fraction was normal at rest and with stress. There was no echocardiographic evidence for stress-induced ischemia.  ------------------------------------------------------------------- Measurements  Left ventricle                         Value        Reference LV ID, ED, PLAX chordal        (L)     41    mm     43 - 52 LV ID, ES, PLAX chordal                28    mm     23 - 38 LV fx shortening, PLAX chordal         32    %      >=29 LV PW thickness, ED                    11    mm     --------- IVS/LV PW ratio, ED                    1            <=1.3  Ventricular septum                     Value  Reference IVS thickness, ED                      11    mm     ---------  LVOT                                   Value        Reference LVOT ID, S                             23    mm     --------- LVOT area                              4.15  cm^2   ---------  Aorta                                  Value        Reference Aortic root ID, ED                     35    mm     --------- Ascending aorta ID, A-P, S              39    mm     ---------  Left atrium                            Value        Reference LA ID, A-P, ES                         37    mm     --------- LA ID/bsa, A-P                         1.86  cm/m^2 <=2.2  Legend: (L)  and  (H)  mark values outside specified reference range.  ------------------------------------------------------------------- Prepared and Electronically Authenticated by  Belva Crome, MD 2019-07-26T15:43:56  ECHOCARDIOGRAM  ECHOCARDIOGRAM COMPLETE 02/27/2022  Narrative ECHOCARDIOGRAM REPORT    Patient Name:   WELLS MABE Date of Exam: 02/27/2022 Medical Rec #:  [id number]    Height:       70.0 in Accession #:    [id number]   Weight:       179.0 lb Date of Birth:  [birth year]   BSA:          1.991 m Patient Age:    70 years     BP:           110/68 mmHg Patient Gender: M            HR:           61 bpm. Exam Location:  [facility]  Procedure: 2D Echo, Cardiac Doppler, Color Doppler and Strain Analysis  Indications:    Dyspnea on exertion [R06.09]  History:        Patient has prior history of Echocardiogram examinations, most recent 04/30/2019. CAD and Previous Myocardial Infarction; Risk Factors:Dyslipidemia.  Sonographer:    Margreta Journey RDCS Referring Phys: [id number] ROBERT J KRASOWSKI  IMPRESSIONS   1. Left ventricular ejection fraction, by  estimation, is 60 to 65%. The left ventricle has normal function. The left ventricle has no regional wall motion abnormalities. Left ventricular diastolic parameters are consistent with Grade I diastolic dysfunction (impaired relaxation).GLS -15.3% 2. Right ventricular systolic function is normal. The right ventricular size is normal. 3. The mitral valve is normal in structure. No evidence of mitral valve regurgitation. No evidence of mitral stenosis. 4. The aortic valve is normal in structure. Aortic valve regurgitation is not visualized. No aortic stenosis is present. 5. The inferior vena cava is  normal in size with greater than 50% respiratory variability, suggesting right atrial pressure of 3 mmHg.  FINDINGS Left Ventricle: Left ventricular ejection fraction, by estimation, is 60 to 65%. The left ventricle has normal function. The left ventricle has no regional wall motion abnormalities. The left ventricular internal cavity size was normal in size. There is no left ventricular hypertrophy. Left ventricular diastolic parameters are consistent with Grade I diastolic dysfunction (impaired relaxation).  Right Ventricle: The right ventricular size is normal. No increase in right ventricular wall thickness. Right ventricular systolic function is normal.  Left Atrium: Left atrial size was normal in size.  Right Atrium: Right atrial size was normal in size.  Pericardium: There is no evidence of pericardial effusion.  Mitral Valve: The mitral valve is normal in structure. No evidence of mitral valve regurgitation. No evidence of mitral valve stenosis.  Tricuspid Valve: The tricuspid valve is normal in structure. Tricuspid valve regurgitation is not demonstrated. No evidence of tricuspid stenosis.  Aortic Valve: The aortic valve is normal in structure. Aortic valve regurgitation is not visualized. No aortic stenosis is present.  Pulmonic Valve: The pulmonic valve was normal in structure. Pulmonic valve regurgitation is not visualized. No evidence of pulmonic stenosis.  Aorta: The aortic root is normal in size and structure.  Venous: The inferior vena cava is normal in size with greater than 50% respiratory variability, suggesting right atrial pressure of 3 mmHg.  IAS/Shunts: No atrial level shunt detected by color flow Doppler.   LEFT VENTRICLE PLAX 2D LVIDd:         4.30 cm   Diastology LVIDs:         2.70 cm   LV e' medial:    6.20 cm/s LV PW:         1.10 cm   LV E/e' medial:  10.9 LV IVS:        1.30 cm   LV e' lateral:   8.27 cm/s LVOT diam:     2.00 cm   LV E/e' lateral:  8.1 LV SV:         51 LV SV Index:   26 LVOT Area:     3.14 cm   RIGHT VENTRICLE             IVC RV Basal diam:  2.50 cm     IVC diam: 1.80 cm RV S prime:     12.10 cm/s TAPSE (M-mode): 2.7 cm  LEFT ATRIUM           Index        RIGHT ATRIUM           Index LA diam:      3.80 cm 1.91 cm/m   RA Area:     10.50 cm LA Vol (A2C): 44.1 ml 22.15 ml/m  RA Volume:   17.90 ml  8.99 ml/m LA Vol (A4C): 31.3 ml 15.72 ml/m AORTIC VALVE LVOT Vmax:   72.50 cm/s LVOT Vmean:  51.700 cm/s LVOT VTI:    0.162 m  AORTA Ao Root diam: 3.30 cm Ao Asc diam:  3.50 cm Ao Desc diam: 2.10 cm  MITRAL VALVE MV Area (PHT): 2.77 cm    SHUNTS MV Decel Time: 274 msec    Systemic VTI:  0.16 m MV E velocity: 67.30 cm/s  Systemic Diam: 2.00 cm MV A velocity: 87.50 cm/s MV E/A ratio:  0.77  Glean Hess Revankar MD Electronically signed by Belva Crome MD Signature Date/Time: 02/27/2022/5:29:18 PM    Final             Risk Assessment/Calculations:             Physical Exam:   VS:  BP 114/62   Pulse (!) 58   Ht 5\' 10"  (1.778 m)   Wt 177 lb 9.6 oz (80.6 kg)   SpO2 96%   BMI 25.48 kg/m    Wt Readings from Last 3 Encounters:  04/04/23 177 lb 9.6 oz (80.6 kg)  02/15/22 179 lb (81.2 kg)  06/29/20 174 lb (78.9 kg)    GEN: Well nourished, well developed in no acute distress NECK: No JVD; No carotid bruits CARDIAC: RRR, no murmurs, rubs, gallops RESPIRATORY:  Clear to auscultation without rales, wheezing or rhonchi  ABDOMEN: Soft, non-tender, non-distended EXTREMITIES:  No edema; No deformity   ASSESSMENT AND PLAN: .   Chest pain of uncertain etiology-is very hard to get information from him regarding how he is feeling, he is most bothered by fatigue which has been going on for several years, also has episodes of chest pain which do not sound to be consistent with angina however I think we need to proceed with an ischemic evaluation.  Will arrange for Lexiscan.  CAD-s/p DES in 2013, unknown  vessel and records are not available for review.  He is having episodes of chest pain does not sound to be consistent with angina, but we will repeat an ischemic evaluation as he also has been bothered by profound fatigue. Continue aspirin 81 mg daily, continue atorvastatin 80 mg daily, will send in nitroglycerin and reviewed how to use. Lipids not at goal, will add Zetia 10 mg daily.   Dyslipidemia - LDL > 80, will add Zetia 10 mg daily, recent LFTs last week were normal. Repeat FLP and LFTs in 8 weeks.   Lack of primary care-he needs to establish with a PCP.  His biggest complaint today is fatigue, was evaluated by Dr. Bing Matter last year and repeat echocardiogram did not reveal any contributory causes.  We will arrange for an ischemic evaluation however there are many things that could be causing fatigue which we need to be arranged by PCP.  He does request that we check him for diabetes.  Fatigue-likely multifactorial.  Echocardiogram last year without contributory causes.  Repeating ischemic evaluation as he also has complaints of atypical chest pain.  Will check thyroid and A1c per his request.       Informed Consent   Shared Decision Making/Informed Consent The risks [chest pain, shortness of breath, cardiac arrhythmias, dizziness, blood pressure fluctuations, myocardial infarction, stroke/transient ischemic attack, nausea, vomiting, allergic reaction, radiation exposure, metallic taste sensation and life-threatening complications (estimated to be 1 in 10,000)], benefits (risk stratification, diagnosing coronary artery disease, treatment guidance) and alternatives of a nuclear stress test were discussed in detail with Mr. Wintle and he agrees to proceed.     Dispo: Lexiscan, nitroglycerin as needed, Zetia 10 mg daily, repeat FLP and LFTs in 8  weeks.  A1c, TSH, CBC.  Follow-up in 1 year with primary cardiologist.  Signed, Flossie Dibble, NP

## 2023-04-01 ENCOUNTER — Other Ambulatory Visit: Payer: Self-pay | Admitting: Cardiology

## 2023-04-01 LAB — HEPATIC FUNCTION PANEL
ALT: 23 [IU]/L (ref 0–44)
AST: 21 [IU]/L (ref 0–40)
Albumin: 4.3 g/dL (ref 3.8–4.8)
Alkaline Phosphatase: 69 [IU]/L (ref 44–121)
Bilirubin Total: 0.9 mg/dL (ref 0.0–1.2)
Bilirubin, Direct: 0.24 mg/dL (ref 0.00–0.40)
Total Protein: 6.7 g/dL (ref 6.0–8.5)

## 2023-04-01 LAB — LIPID PANEL
Chol/HDL Ratio: 3.6 {ratio} (ref 0.0–5.0)
Cholesterol, Total: 153 mg/dL (ref 100–199)
HDL: 42 mg/dL (ref >39)
LDL Chol Calc (NIH): 83 mg/dL (ref 0–99)
Triglycerides: 159 mg/dL — ABNORMAL HIGH (ref 0–149)
VLDL Cholesterol Cal: 28 mg/dL (ref 5–40)

## 2023-04-04 ENCOUNTER — Encounter: Payer: Self-pay | Admitting: Cardiology

## 2023-04-04 ENCOUNTER — Ambulatory Visit: Payer: Medicare Other | Attending: Cardiology | Admitting: Cardiology

## 2023-04-04 VITALS — BP 114/62 | HR 58 | Ht 70.0 in | Wt 177.6 lb

## 2023-04-04 DIAGNOSIS — R5383 Other fatigue: Secondary | ICD-10-CM | POA: Diagnosis present

## 2023-04-04 DIAGNOSIS — I25119 Atherosclerotic heart disease of native coronary artery with unspecified angina pectoris: Secondary | ICD-10-CM | POA: Diagnosis present

## 2023-04-04 DIAGNOSIS — R072 Precordial pain: Secondary | ICD-10-CM

## 2023-04-04 DIAGNOSIS — Z131 Encounter for screening for diabetes mellitus: Secondary | ICD-10-CM

## 2023-04-04 DIAGNOSIS — Z758 Other problems related to medical facilities and other health care: Secondary | ICD-10-CM | POA: Diagnosis present

## 2023-04-04 DIAGNOSIS — E785 Hyperlipidemia, unspecified: Secondary | ICD-10-CM | POA: Diagnosis present

## 2023-04-04 MED ORDER — NITROGLYCERIN 0.4 MG SL SUBL: 0.4000 mg | SUBLINGUAL_TABLET | SUBLINGUAL | 3 refills | Status: AC | PRN

## 2023-04-04 MED ORDER — EZETIMIBE 10 MG PO TABS: 10.0000 mg | ORAL_TABLET | Freq: Every day | ORAL | 3 refills | Status: AC

## 2023-04-04 NOTE — Patient Instructions (Signed)
Medication Instructions:  Your physician has recommended you make the following change in your medication:  Start Zetia 10 mg once daily Nitroglycerin 0.4 mg sublingual (under your tongue) as needed for chest pain. If experiencing chest pain, stop what you are doing and sit down. Take 1 nitroglycerin and wait 5 minutes. If chest pain continues, take another nitroglycerin and wait 5 minutes. If chest pain does not subside, take 1 more nitroglycerin and dial 911. You make take a total of 3 nitroglycerin in a 15 minute time frame.   *If you need a refill on your cardiac medications before your next appointment, please call your pharmacy*   Lab Work: Your physician recommends that you return for lab work in: Today for CBC, TSH and A1C Come back in 8 weeks for Fasting Liver Panel and Liver Profile Lab opens at 8am. You DO NOT NEED an appointment. Best time to come is between 8am and 11:45 and between 1:30 and 4:30. If you have been asked to fast for your blood work please have nothing to eat or drink after midnight. You may have water.   If you have labs (blood work) drawn today and your tests are completely normal, you will receive your results only by: MyChart Message (if you have MyChart) OR A paper copy in the mail If you have any lab test that is abnormal or we need to change your treatment, we will call you to review the results.   Testing/Procedures: Your physician has requested that you have a lexiscan myoview. For further information please visit [email or web address] Please follow instruction sheet, as given.   The test will take approximately 3 to 4 hours to complete; you may bring reading material.  If someone comes with you to your appointment, they will need to remain in the main lobby due to limited space in the testing area.How to prepare for your Myocardial Perfusion Test:             Do not take Erectile Dysfunction medication 48 hours prior to test. Do not eat or drink 3 hours  prior to your test, except you may have water. Do not consume products containing caffeine (regular or decaffeinated) 12 hours prior to your test. (ex: coffee, chocolate, sodas, tea). Do bring a list of your current medications with you.  If not listed below, you may take your medications as normal. Do wear comfortable clothes (no dresses or overalls) and walking shoes, tennis shoes preferred (No heels or open toe shoes are allowed). Do NOT wear cologne, perfume, aftershave, or lotions (deodorant is allowed). If these instructions are not followed, your test will have to be rescheduled.    Follow-Up: At [facility], you and your health needs are our priority.  As part of our continuing mission to provide you with exceptional heart care, we have created designated Provider Care Teams.  These Care Teams include your primary Cardiologist (physician) and Advanced Practice Providers (APPs -  Physician Assistants and Nurse Practitioners) who all work together to provide you with the care you need, when you need it.  We recommend signing up for the patient portal called "MyChart".  Sign up information is provided on this After Visit Summary.  MyChart is used to connect with patients for Virtual Visits (Telemedicine).  Patients are able to view lab/test results, encounter notes, upcoming appointments, etc.  Non-urgent messages can be sent to your provider as well.   To learn more about what you can do with MyChart, go to  [email or web address].    Your next appointment:   1 year(s)  Provider:   Gypsy Balsam, MD    Other Instructions

## 2023-04-05 ENCOUNTER — Other Ambulatory Visit: Payer: Self-pay | Admitting: Cardiology

## 2023-04-05 LAB — TSH: TSH: 4.04 u[IU]/mL (ref 0.450–4.500)

## 2023-04-05 LAB — CBC WITH DIFFERENTIAL/PLATELET
Basophils Absolute: 0 x10E3/uL (ref 0.0–0.2)
Basos: 1 %
EOS (ABSOLUTE): 0.1 x10E3/uL (ref 0.0–0.4)
Eos: 2 %
Hematocrit: 45.9 % (ref 37.5–51.0)
Hemoglobin: 14.9 g/dL (ref 13.0–17.7)
Immature Grans (Abs): 0 x10E3/uL (ref 0.0–0.1)
Immature Granulocytes: 1 %
Lymphocytes Absolute: 1.4 x10E3/uL (ref 0.7–3.1)
Lymphs: 28 %
MCH: 29.4 pg (ref 26.6–33.0)
MCHC: 32.5 g/dL (ref 31.5–35.7)
MCV: 91 fL (ref 79–97)
Monocytes Absolute: 0.4 x10E3/uL (ref 0.1–0.9)
Monocytes: 9 %
Neutrophils Absolute: 3 x10E3/uL (ref 1.4–7.0)
Neutrophils: 59 %
Platelets: 162 x10E3/uL (ref 150–450)
RBC: 5.07 x10E6/uL (ref 4.14–5.80)
RDW: 13.2 % (ref 11.6–15.4)
WBC: 5.1 x10E3/uL (ref 3.4–10.8)

## 2023-04-05 LAB — HEMOGLOBIN A1C
Est. average glucose Bld gHb Est-mCnc: 117 mg/dL
Hgb A1c MFr Bld: 5.7 % — ABNORMAL HIGH (ref 4.8–5.6)

## 2023-04-07 ENCOUNTER — Telehealth: Payer: Self-pay | Admitting: Emergency Medicine

## 2023-04-07 NOTE — Telephone Encounter (Signed)
Results reviewed with pt as per Geoffrey Bamberg NP's note.  Pt verbalized understanding and had no additional questions.

## 2023-04-07 NOTE — Telephone Encounter (Signed)
-----   Message from Flossie Dibble sent at 04/05/2023  2:19 PM EST ----- No diabetes. Thyroid normal. CBC without anemia or infection. Nothing to indicate profound fatigue.  Recommendations to establish with a primary care physician for further evaluation.

## 2023-04-08 ENCOUNTER — Telehealth: Payer: Self-pay

## 2023-04-08 ENCOUNTER — Telehealth (HOSPITAL_COMMUNITY): Payer: Self-pay | Admitting: *Deleted

## 2023-04-08 DIAGNOSIS — I25119 Atherosclerotic heart disease of native coronary artery with unspecified angina pectoris: Secondary | ICD-10-CM

## 2023-04-08 DIAGNOSIS — E785 Hyperlipidemia, unspecified: Secondary | ICD-10-CM

## 2023-04-08 NOTE — Telephone Encounter (Signed)
Patient notified of results and recommendations and agreed with plan, medication sent previously by Wallis Bamberg which pt confirms he has this on hand, lab order on file.

## 2023-04-08 NOTE — Telephone Encounter (Signed)
-----   Message from Gypsy Balsam sent at 04/02/2023 10:08 AM EST ----- Cholesterol still elevated, please add Zetia 10 mg daily to medical regiment, fasting lipid profile, AST LT 6 weeks

## 2023-04-08 NOTE — Telephone Encounter (Signed)
 Patient given detailed instructions per Myocardial Perfusion Study Information Sheet for the test on 04/15/2023 at 11:00. Patient notified to arrive 15 minutes early and that it is imperative to arrive on time for appointment to keep from having the test rescheduled.  If you need to cancel or reschedule your appointment, please call the office within 24 hours of your appointment. . Patient verbalized understanding.Geoffrey Kelly

## 2023-04-15 ENCOUNTER — Ambulatory Visit: Payer: Medicare Other

## 2023-05-30 LAB — LIPID PANEL
Chol/HDL Ratio: 2.8 ratio (ref 0.0–5.0)
Cholesterol, Total: 111 mg/dL (ref 100–199)
HDL: 40 mg/dL (ref >39)
LDL Chol Calc (NIH): 58 mg/dL (ref 0–99)
Triglycerides: 61 mg/dL (ref 0–149)
VLDL Cholesterol Cal: 13 mg/dL (ref 5–40)

## 2023-05-30 LAB — ALT: ALT: 24 IU/L (ref 0–44)

## 2023-05-30 LAB — AST: AST: 20 IU/L (ref 0–40)

## 2023-06-05 ENCOUNTER — Ambulatory Visit: Attending: Cardiology

## 2023-06-05 DIAGNOSIS — R072 Precordial pain: Secondary | ICD-10-CM | POA: Insufficient documentation

## 2023-06-05 LAB — MYOCARDIAL PERFUSION IMAGING
LV dias vol: 78 mL (ref 62–150)
LV sys vol: 31 mL
Nuc Stress EF: 61 %
Peak HR: 84 {beats}/min
Rest HR: 51 {beats}/min
Rest Nuclear Isotope Dose: 10.6 mCi
SDS: 1
SRS: 0
SSS: 1
ST Depression (mm): 0 mm
Stress Nuclear Isotope Dose: 32.4 mCi
TID: 0.99

## 2023-06-05 MED ORDER — REGADENOSON 0.4 MG/5ML IV SOLN
0.4000 mg | Freq: Once | INTRAVENOUS | Status: AC
  Administered 2023-06-05: 0.4 mg via INTRAVENOUS

## 2023-06-05 MED ORDER — TECHNETIUM TC 99M TETROFOSMIN IV KIT
10.6000 | PACK | Freq: Once | INTRAVENOUS | Status: AC | PRN
  Administered 2023-06-05: 10.6 via INTRAVENOUS

## 2023-06-05 MED ORDER — TECHNETIUM TC 99M TETROFOSMIN IV KIT
32.4000 | PACK | Freq: Once | INTRAVENOUS | Status: AC | PRN
  Administered 2023-06-05: 32.4 via INTRAVENOUS

## 2023-06-09 ENCOUNTER — Telehealth: Payer: Self-pay | Admitting: Cardiology

## 2023-06-09 NOTE — Telephone Encounter (Signed)
 Pt called in for lab results and stress test results

## 2023-06-09 NOTE — Telephone Encounter (Signed)
 Reviewed stress test results with patient as per [facility] note. Patient verbalized understanding and had no further questions.

## 2023-12-27 ENCOUNTER — Other Ambulatory Visit: Payer: Self-pay | Admitting: Cardiology

## 2024-03-18 ENCOUNTER — Other Ambulatory Visit: Payer: Self-pay | Admitting: Cardiology

## 2024-03-18 ENCOUNTER — Telehealth: Payer: Self-pay | Admitting: Cardiology

## 2024-03-18 MED ORDER — ATORVASTATIN CALCIUM 80 MG PO TABS: 80.0000 mg | ORAL_TABLET | Freq: Every day | ORAL | 0 refills | Status: AC

## 2024-03-18 MED ORDER — EZETIMIBE 10 MG PO TABS: 10.0000 mg | ORAL_TABLET | Freq: Every day | ORAL | 0 refills | Status: AC

## 2024-03-18 NOTE — Telephone Encounter (Signed)
 Requested Prescriptions   Signed Prescriptions Disp Refills   atorvastatin  (LIPITOR) 80 MG tablet 90 tablet 0    Sig: Take 1 tablet (80 mg total) by mouth daily.    Authorizing Provider: BERNIE LAMAR PARAS    Ordering User: WILFRED, Bailyn Spackman  C   ezetimibe  (ZETIA ) 10 MG tablet 90 tablet 0    Sig: Take 1 tablet (10 mg total) by mouth daily.    Authorizing Provider: BERNIE LAMAR PARAS    Ordering User: WILFRED, Griff Badley  C

## 2024-03-18 NOTE — Telephone Encounter (Signed)
" °*  STAT* If patient is at the pharmacy, call can be transferred to refill team.   1. Which medications need to be refilled? (please list name of each medication and dose if known) atorvastatin  (LIPITOR) 80 MG tablet  ezetimibe  (ZETIA ) 10 MG tablet (Expired)    2. Would you like to learn more about the convenience, safety, & potential cost savings by using the [facility]?    3. Are you open to using the [facility] (Type [facility].  ).   4. Which pharmacy/location (including street and city if local pharmacy) is medication to be sent to? [id number] - [place], [place] - [address]    5. Do they need a 30 day or 90 day supply? 90 day   "

## 2024-05-20 ENCOUNTER — Ambulatory Visit: Admitting: Cardiology
